# Patient Record
Sex: Male | Born: 1965 | Race: Black or African American | Hispanic: No | Marital: Single | Smoking: Never smoker
Health system: Southern US, Community
[De-identification: ages and names within clinical notes are randomized; demographics above are authoritative.]

## PROBLEM LIST (undated history)

## (undated) ENCOUNTER — Emergency Department (HOSPITAL_COMMUNITY): Admission: EM | Payer: BLUE CROSS/BLUE SHIELD | Source: Home / Self Care

## (undated) DIAGNOSIS — R011 Cardiac murmur, unspecified: Secondary | ICD-10-CM

---

## 2011-08-03 ENCOUNTER — Encounter (HOSPITAL_COMMUNITY): Payer: Self-pay | Admitting: *Deleted

## 2011-08-03 ENCOUNTER — Emergency Department (HOSPITAL_COMMUNITY)
Admission: EM | Admit: 2011-08-03 | Discharge: 2011-08-03 | Disposition: A | Discharge status: Home / Self Care | Payer: Self-pay | Attending: Emergency Medicine | Admitting: Emergency Medicine

## 2011-08-03 DIAGNOSIS — Z880 Allergy status to penicillin: Secondary | ICD-10-CM | POA: Insufficient documentation

## 2011-08-03 DIAGNOSIS — R55 Syncope and collapse: Secondary | ICD-10-CM | POA: Insufficient documentation

## 2011-08-03 NOTE — ED Provider Notes (Signed)
History     CSN: [id number]  Arrival date & time 08/03/11  4098   First MD Initiated Contact with Patient 08/03/11 1108      Chief Complaint  Patient presents with  . Near Syncope    (Consider location/radiation/quality/duration/timing/severity/associated sxs/prior treatment) HPI  States that 3 days ago he "almost passed out" at the job. Stated that he felt tired and hot. Was standing up and suddenly felt hot and laid down on the ground. Denies syncope. No seizure. Was sent home from work. No problem since that time. EMS arrived and pt declined transport. States that he had EKG done at that time that was "ok". Since that time has been asx. Denies headache, dizziness, cp, palpitations, shortness of breath pre fall and currently. No neck pain or back pain. Denies h/o VTE in self or family. No recent hosp/surg/immob. No h/o cancer. Denies exogenous hormone use, no leg pain or swelling  States that he has been stressed out due to mother's recent illness/cardiac arrest. Went from there to his job with little sleep. Also feels dehydrated.   ED Notes, ED Provider Notes from 08/03/11 0000 to 08/03/11 09:56:14       Melissa Rolan Bucco, RN 08/03/2011 09:55      To ED for eval after having a syncopal episode on Friday night at work. States he needs to be cleared to go back to work. Seen by EMS but no provider. States he thinks this is due to lack of sleep. No pain or sob. States he felt hot prior to this episode. No syncope since                 Original note by Graciela Husbands, RN at 08/03/2011 09:55         Melissa Rolan Bucco, RN 08/03/2011 09:55      To ED for eval after having a syncopal episode on Friday night at work. States he needs to be cleared to go back to work. Seen by EMS but no provider. States he thinks this is due to lack of sleep. No pain or sob. States he felt hot prior to this episode. No episode since                History reviewed. No pertinent past medical  history.  History reviewed. No pertinent past surgical history.  History reviewed. No pertinent family history.  History  Substance Use Topics  . Smoking status: Never Smoker   . Smokeless tobacco: Not on file  . Alcohol Use: Yes    Review of Systems  All other systems reviewed and are negative.  except as noted HPI    Allergies  Penicillins  Home Medications  No current outpatient prescriptions on file.  BP 125/74  Pulse 67  Temp 97.5 F (36.4 C) (Oral)  Resp 18  SpO2 100%  Physical Exam  Nursing note and vitals reviewed. Constitutional: He is oriented to person, place, and time. He appears well-developed and well-nourished. No distress.  HENT:  Head: Atraumatic.  Mouth/Throat: Oropharynx is clear and moist.  Eyes: Conjunctivae are normal. Pupils are equal, round, and reactive to light.  Neck: Neck supple.  Cardiovascular: Normal rate, regular rhythm, normal heart sounds and intact distal pulses.  Exam reveals no gallop and no friction rub.   No murmur heard. Pulmonary/Chest: Effort normal. No respiratory distress. He has no wheezes. He has no rales.  Abdominal: Soft. Bowel sounds are normal. There is no tenderness. There is no rebound and  no guarding.  Musculoskeletal: Normal range of motion. He exhibits no edema and no tenderness.  Neurological: He is alert and oriented to person, place, and time. No cranial nerve deficit. He exhibits normal muscle tone. Coordination normal.  Skin: Skin is warm and dry. No rash noted. No erythema.  Psychiatric: He has a normal mood and affect.    Date: 08/03/2011  Rate: 48  Rhythm: sinus bradycardia  QRS Axis: normal  Intervals: normal  ST/T Wave abnormalities: normal  Conduction Disutrbances:none  Narrative Interpretation: moderate voltage criteria LVH, may be nl variant  Old EKG Reviewed: none available    ED Course  Procedures (including critical care time)  Labs Reviewed - No data to display No results  found.   1. Near syncope     MDM  Fatigue, near syncope. Likely related to recent stressors as well as lack of sleep, mild dehydration. EKG with moderate LVH criteria, pt without significant murmur on exam, no cp/sob/palpitations/dizziness. Aware and will f/u as outpatient with PMD. No EMC precluding discharge at this time. Given Precautions for return. PMD f/u.         Forbes Cellar, MD 08/03/11 1247

## 2011-08-03 NOTE — ED Notes (Addendum)
To ED for eval after having a syncopal episode on Friday night at work. States he needs to be cleared to go back to work. Seen by EMS but no provider. States he thinks this is due to lack of sleep. No pain or sob. States he felt hot prior to this episode. No syncope since

## 2011-08-03 NOTE — Discharge Instructions (Signed)
Emotional Crisis Part of your problem today may be due to an emotional crisis. Emotional states can cause many different physical signs and symptoms. These may include:  Chest or stomach pain.   Fluttering heartbeat.   Passing out.   Breathing difficulty.   Headaches.   Trembling.   Hot or cold flashes.   Numbness.   Dizziness.   Unusual muscle pain or fatigue.   Insomnia.  When you have other medical problems, they are often made worse by emotional upsets. Emotional crises can increase your stress and anxiety. Finding ways to reduce your stress level can make you feel better. You will become more capable of dealing with these emotional states. Regular physical exercise such as walking can be very beneficial. Counseling or medicine to treat anxiety or depression may also be needed. See your caregiver if you have further problems or questions about your condition. Document Released: 01/26/2005 Document Revised: 01/15/2011 Document Reviewed: 07/13/2006 [facility] Patient Information 2012 [place], [place].  RESOURCE GUIDE  Dental Problems  Patients with Medicaid: [facility] [phone]                                           [phone] [address] Phone:  [phone]                                                   Phone:  [phone]  If unable to pay or uninsured, contact:  [facility] or [facility]. to become qualified for the adult dental clinic.  Chronic Pain Problems Contact [facility]  [phone] Patients need to be referred by their primary care doctor.  Insufficient Money for Medicine Contact [employer] Way:  call "211" or [facility] [phone].  No Primary Care Doctor Call [facility]  [phone] Other agencies that provide inexpensive medical care    [facility]  [phone]    [facility]  [phone]    [facility]   [phone]    [facility]  [phone]    [facility]  5347384439    [facility]  [phone]  Psychological Services [facility]  [phone] [facility]  [phone] [facility]   [phone] (emergency services [phone])  Abuse/Neglect [facility] Child Abuse Hotline 940-671-6546 [facility] Child Abuse Hotline [phone] (After Hours)  Emergency Shelter [facility] [phone]  [facility] [facility] [phone] [facility] [phone]  MRSA Hotline #:   802-886-5863    [facility] Resources  [facility]  [employer]                           [facility] [address]                     [address]         [address]  [address]  Cristobal Goldmann Phone:  [phone]                                  Phone:  [phone]                   Phone:  [phone]  [facility] Phone:  [phone]  [facility] Child Abuse Hotline (260)229-9500 [phone] (After Hours)

## 2013-09-15 ENCOUNTER — Emergency Department (HOSPITAL_COMMUNITY)
Admission: EM | Admit: 2013-09-15 | Discharge: 2013-09-15 | Disposition: A | Discharge status: Home / Self Care | Payer: Self-pay | Attending: Emergency Medicine | Admitting: Emergency Medicine

## 2013-09-15 ENCOUNTER — Encounter (HOSPITAL_COMMUNITY): Payer: Self-pay | Admitting: Emergency Medicine

## 2013-09-15 DIAGNOSIS — R319 Hematuria, unspecified: Secondary | ICD-10-CM | POA: Insufficient documentation

## 2013-09-15 DIAGNOSIS — R51 Headache: Secondary | ICD-10-CM | POA: Insufficient documentation

## 2013-09-15 DIAGNOSIS — R300 Dysuria: Secondary | ICD-10-CM | POA: Insufficient documentation

## 2013-09-15 DIAGNOSIS — N342 Other urethritis: Secondary | ICD-10-CM | POA: Insufficient documentation

## 2013-09-15 DIAGNOSIS — Z880 Allergy status to penicillin: Secondary | ICD-10-CM | POA: Insufficient documentation

## 2013-09-15 LAB — CBC WITH DIFFERENTIAL/PLATELET
BASOS ABS: 0 10*3/uL (ref 0.0–0.1)
Basophils Relative: 0 % (ref 0–1)
EOS PCT: 1 % (ref 0–5)
Eosinophils Absolute: 0.1 10*3/uL (ref 0.0–0.7)
HCT: 38.6 % — ABNORMAL LOW (ref 39.0–52.0)
Hemoglobin: 12.4 g/dL — ABNORMAL LOW (ref 13.0–17.0)
LYMPHS ABS: 1.6 10*3/uL (ref 0.7–4.0)
LYMPHS PCT: 22 % (ref 12–46)
MCH: 32.1 pg (ref 26.0–34.0)
MCHC: 32.1 g/dL (ref 30.0–36.0)
MCV: 100 fL (ref 78.0–100.0)
Monocytes Absolute: 0.6 10*3/uL (ref 0.1–1.0)
Monocytes Relative: 8 % (ref 3–12)
NEUTROS ABS: 5 10*3/uL (ref 1.7–7.7)
NEUTROS PCT: 69 % (ref 43–77)
PLATELETS: 224 10*3/uL (ref 150–400)
RBC: 3.86 MIL/uL — AB (ref 4.22–5.81)
RDW: 12.9 % (ref 11.5–15.5)
WBC: 7.2 10*3/uL (ref 4.0–10.5)

## 2013-09-15 LAB — I-STAT CHEM 8, ED
BUN: 13 mg/dL (ref 6–23)
CHLORIDE: 101 meq/L (ref 96–112)
CREATININE: 0.8 mg/dL (ref 0.50–1.35)
Calcium, Ion: 1.15 mmol/L (ref 1.12–1.23)
Glucose, Bld: 87 mg/dL (ref 70–99)
HCT: 41 % (ref 39.0–52.0)
Hemoglobin: 13.9 g/dL (ref 13.0–17.0)
POTASSIUM: 3.7 meq/L (ref 3.7–5.3)
SODIUM: 141 meq/L (ref 137–147)
TCO2: 28 mmol/L (ref 0–100)

## 2013-09-15 LAB — URINALYSIS, ROUTINE W REFLEX MICROSCOPIC
BILIRUBIN URINE: NEGATIVE
Glucose, UA: NEGATIVE mg/dL
KETONES UR: 15 mg/dL — AB
NITRITE: NEGATIVE
PH: 5.5 (ref 5.0–8.0)
Protein, ur: 30 mg/dL — AB
SPECIFIC GRAVITY, URINE: 1.034 — AB (ref 1.005–1.030)
Urobilinogen, UA: 1 mg/dL (ref 0.0–1.0)

## 2013-09-15 LAB — URINE MICROSCOPIC-ADD ON

## 2013-09-15 MED ORDER — DOXYCYCLINE HYCLATE 100 MG PO TABS
100.0000 mg | ORAL_TABLET | Freq: Once | ORAL | Status: AC
  Administered 2013-09-15: 100 mg via ORAL
  Filled 2013-09-15: qty 1

## 2013-09-15 MED ORDER — CIPROFLOXACIN HCL 500 MG PO TABS
750.0000 mg | ORAL_TABLET | Freq: Once | ORAL | Status: AC
  Administered 2013-09-15: 750 mg via ORAL
  Filled 2013-09-15: qty 2

## 2013-09-15 MED ORDER — DOXYCYCLINE HYCLATE 100 MG PO TABS: 100.0000 mg | ORAL_TABLET | Freq: Two times a day (BID) | ORAL | Status: DC

## 2013-09-15 MED ORDER — AZITHROMYCIN 250 MG PO TABS
1000.0000 mg | ORAL_TABLET | Freq: Once | ORAL | Status: AC
  Administered 2013-09-15: 1000 mg via ORAL
  Filled 2013-09-15: qty 4

## 2013-09-15 MED ORDER — CIPROFLOXACIN HCL 750 MG PO TABS: 750.0000 mg | ORAL_TABLET | Freq: Every day | ORAL | Status: DC

## 2013-09-15 NOTE — Discharge Instructions (Signed)
Dysuria Dysuria is the medical term for pain with urination. There are many causes for dysuria, but urinary tract infection is the most common. If a urinalysis was performed it can show that there is a urinary tract infection. A urine culture confirms that you or your child is sick. You will need to follow up with a healthcare provider because:  If a urine culture was done you will need to know the culture results and treatment recommendations.  If the urine culture was positive, you or your child will need to be put on antibiotics or know if the antibiotics prescribed are the right antibiotics for your urinary tract infection.  If the urine culture is negative (no urinary tract infection), then other causes may need to be explored or antibiotics need to be stopped. Today laboratory work may have been done and there does not seem to be an infection. If cultures were done they will take at least 24 to 48 hours to be completed. Today x-rays may have been taken and they read as normal. No cause can be found for the problems. The x-rays may be re-read by a radiologist and you will be contacted if additional findings are made. You or your child may have been put on medications to help with this problem until you can see your primary caregiver. If the problems get better, see your primary caregiver if the problems return. If you were given antibiotics (medications which kill germs), take all of the mediations as directed for the full course of treatment.  If laboratory work was done, you need to find the results. Leave a telephone number where you can be reached. If this is not possible, make sure you find out how you are to get test results. HOME CARE INSTRUCTIONS   Drink lots of fluids. For adults, drink eight, 8 ounce glasses of clear juice or water a day. For children, replace fluids as suggested by your caregiver.  Empty the bladder often. Avoid holding urine for long periods of time.  After a bowel  movement, women should cleanse front to back, using each tissue only once.  Empty your bladder before and after sexual intercourse.  Take all the medicine given to you until it is gone. You may feel better in a few days, but TAKE ALL MEDICINE.  Avoid caffeine, tea, alcohol and carbonated beverages, because they tend to irritate the bladder.  In men, alcohol may irritate the prostate.  Only take over-the-counter or prescription medicines for pain, discomfort, or fever as directed by your caregiver.  If your caregiver has given you a follow-up appointment, it is very important to keep that appointment. Not keeping the appointment could result in a chronic or permanent injury, pain, and disability. If there is any problem keeping the appointment, you must call back to this facility for assistance. SEEK IMMEDIATE MEDICAL CARE IF:   Back pain develops.  A fever develops.  There is nausea (feeling sick to your stomach) or vomiting (throwing up).  Problems are no better with medications or are getting worse. MAKE SURE YOU:   Understand these instructions.  Will watch your condition.  Will get help right away if you are not doing well or get worse. Document Released: 10/25/2003 Document Revised: 04/20/2011 Document Reviewed: 09/01/2007 [facility] Patient Information 2015 [place], [place]. This information is not intended to replace advice given to you by your health care provider. Make sure you discuss any questions you have with your health care provider.  Hematuria Hematuria is  blood in your urine. It can be caused by a bladder infection, kidney infection, prostate infection, kidney stone, or cancer of your urinary tract. Infections can usually be treated with medicine, and a kidney stone usually will pass through your urine. If neither of these is the cause of your hematuria, further workup to find out the reason may be needed. It is very important that you tell your health care provider  about any blood you see in your urine, even if the blood stops without treatment or happens without causing pain. Blood in your urine that happens and then stops and then happens again can be a symptom of a very serious condition. Also, pain is not a symptom in the initial stages of many urinary cancers. HOME CARE INSTRUCTIONS   Drink lots of fluid, 3-4 quarts a day. If you have been diagnosed with an infection, cranberry juice is especially recommended, in addition to large amounts of water.  Avoid caffeine, tea, and carbonated beverages because they tend to irritate the bladder.  Avoid alcohol because it may irritate the prostate.  Take all medicines as directed by your health care provider.  If you were prescribed an antibiotic medicine, finish it all even if you start to feel better.  If you have been diagnosed with a kidney stone, follow your health care provider's instructions regarding straining your urine to catch the stone.  Empty your bladder often. Avoid holding urine for long periods of time.  After a bowel movement, women should cleanse front to back. Use each tissue only once.  Empty your bladder before and after sexual intercourse if you are a male. SEEK MEDICAL CARE IF:  You develop back pain.  You have a fever.  You have a feeling of sickness in your stomach (nausea) or vomiting.  Your symptoms are not better in 3 days. Return sooner if you are getting worse. SEEK IMMEDIATE MEDICAL CARE IF:   You develop severe vomiting and are unable to keep the medicine down.  You develop severe back or abdominal pain despite taking your medicines.  You begin passing a large amount of blood or clots in your urine.  You feel extremely weak or faint, or you pass out. MAKE SURE YOU:   Understand these instructions.  Will watch your condition.  Will get help right away if you are not doing well or get worse. Document Released: 01/26/2005 Document Revised: 06/12/2013  Document Reviewed: 09/26/2012 [facility] Patient Information 2015 [place]. This information is not intended to replace advice given to you by your health care provider. Make sure you discuss any questions you have with your health care provider. Discussed.  Cultures are, pending.  You'll be notified with results You have been given prescriptions for antibiotics to treat urethritis.  Please take these as directed until, completed if you continue to have blood in your urine.  Please make an appointment with urologist for further evaluation

## 2013-09-15 NOTE — ED Notes (Addendum)
Pt presents with concerns of bloody urine and burning at the end of urination staring last night. Pt denies penile discharge

## 2013-09-15 NOTE — ED Provider Notes (Signed)
CSN: [id number]     Arrival date & time 09/15/13  1804 History   First MD Initiated Contact with Patient 09/15/13 1953     Chief Complaint  Patient presents with  . Hematuria     (Consider location/radiation/quality/duration/timing/severity/associated sxs/prior Treatment) HPI Comments: Noticed blood in his urine yesterday  That has continued throughout the day.  States unprotected intercourse, denies penile discharge but does report pain at end of urinary stream, and headache   Patient is a 48 y.o. male presenting with hematuria. The history is provided by the patient.  Hematuria This is a new problem. The current episode started yesterday. The problem occurs constantly. The problem has been unchanged. Associated symptoms include headaches. Pertinent negatives include no chills, diaphoresis, numbness, rash or weakness. Nothing aggravates the symptoms. He has tried nothing for the symptoms. The treatment provided no relief.    History reviewed. No pertinent past medical history. History reviewed. No pertinent past surgical history. History reviewed. No pertinent family history. History  Substance Use Topics  . Smoking status: Never Smoker   . Smokeless tobacco: Not on file  . Alcohol Use: Yes    Review of Systems  Constitutional: Negative for chills and diaphoresis.  Genitourinary: Positive for dysuria and hematuria. Negative for decreased urine volume, discharge, penile swelling, scrotal swelling, penile pain and testicular pain.  Skin: Negative for rash.  Neurological: Positive for headaches. Negative for weakness and numbness.      Allergies  Penicillins  Home Medications   Prior to Admission medications   Not on File   BP 105/83  Pulse 77  Temp(Src) 98.6 F (37 C) (Oral)  Resp 12  SpO2 99% Physical Exam  Nursing note and vitals reviewed. Constitutional: He appears well-developed and well-nourished.  HENT:  Head: Normocephalic.  Eyes: Pupils are equal, round,  and reactive to light.  Neck: Normal range of motion.  Cardiovascular: Normal rate and regular rhythm.   Pulmonary/Chest: Effort normal.  Abdominal: Soft. Bowel sounds are normal. He exhibits no distension.  Genitourinary: Penis normal. Right testis shows no tenderness. Left testis shows no tenderness. Circumcised. No penile tenderness.  Musculoskeletal: Normal range of motion.  Neurological: He is alert.  Skin: Skin is warm and dry.    ED Course  Procedures (including critical care time) Labs Review Labs Reviewed  URINALYSIS, ROUTINE W REFLEX MICROSCOPIC - Abnormal; Notable for the following:    APPearance HAZY (*)    Specific Gravity, Urine 1.034 (*)    Hgb urine dipstick LARGE (*)    Ketones, ur 15 (*)    Protein, ur 30 (*)    Leukocytes, UA SMALL (*)    All other components within normal limits  URINE MICROSCOPIC-ADD ON - Abnormal; Notable for the following:    Bacteria, UA FEW (*)    All other components within normal limits  CBC WITH DIFFERENTIAL - Abnormal; Notable for the following:    RBC 3.86 (*)    Hemoglobin 12.4 (*)    HCT 38.6 (*)    All other components within normal limits  GC/CHLAMYDIA PROBE AMP  RPR  HIV ANTIBODY (ROUTINE TESTING)  I-STAT CHEM 8, ED    Imaging Review No results found.   EKG Interpretation None      MDM   Final diagnoses:  Hematuria  Dysuria  Urethritis     Due to patient's penicillin allergy.  He is been given Cipro, doxycycline, and azithromycin, and discharged home with prescriptions for Cipro and doxycycline and been told.  His  cultures are pending.  If he continues to have hematuria.  He is to followup with Dr. Laverle Patter at [facility].  Urology    Arman Filter, NP 09/15/13 2156

## 2013-09-15 NOTE — ED Notes (Signed)
Pt also reports headaches starting yesterday

## 2013-09-15 NOTE — ED Notes (Signed)
Pt comfortable with discharge and follow up instructions. Prescriptions x2. Pt declines wheelchair, escorted to waiting area. 

## 2013-09-16 LAB — GC/CHLAMYDIA PROBE AMP
CT PROBE, AMP APTIMA: POSITIVE — AB
GC PROBE AMP APTIMA: NEGATIVE

## 2013-09-16 LAB — HIV ANTIBODY (ROUTINE TESTING W REFLEX): HIV 1&2 Ab, 4th Generation: NONREACTIVE

## 2013-09-16 LAB — RPR

## 2013-09-16 NOTE — ED Provider Notes (Signed)
Medical screening examination/treatment/procedure(s) were performed by non-physician practitioner and as supervising physician I was immediately available for consultation/collaboration.   EKG Interpretation None       Vanetta MuldersScott Ridgely Anastacio, MD 09/16/13 [phone]

## 2013-09-17 ENCOUNTER — Telehealth (HOSPITAL_BASED_OUTPATIENT_CLINIC_OR_DEPARTMENT_OTHER): Payer: Self-pay | Admitting: Emergency Medicine

## 2013-09-17 NOTE — Telephone Encounter (Signed)
+  Chlamydia. Patient treated with Zithromax. [employer] faxed. 

## 2014-09-25 ENCOUNTER — Emergency Department (HOSPITAL_COMMUNITY)
Admission: EM | Admit: 2014-09-25 | Discharge: 2014-09-25 | Disposition: A | Discharge status: Home / Self Care | Payer: BLUE CROSS/BLUE SHIELD | Attending: Emergency Medicine | Admitting: Emergency Medicine

## 2014-09-25 ENCOUNTER — Encounter (HOSPITAL_COMMUNITY): Payer: Self-pay | Admitting: Neurology

## 2014-09-25 DIAGNOSIS — Z202 Contact with and (suspected) exposure to infections with a predominantly sexual mode of transmission: Secondary | ICD-10-CM | POA: Insufficient documentation

## 2014-09-25 DIAGNOSIS — K219 Gastro-esophageal reflux disease without esophagitis: Secondary | ICD-10-CM

## 2014-09-25 DIAGNOSIS — J029 Acute pharyngitis, unspecified: Secondary | ICD-10-CM | POA: Diagnosis present

## 2014-09-25 DIAGNOSIS — Z880 Allergy status to penicillin: Secondary | ICD-10-CM | POA: Insufficient documentation

## 2014-09-25 DIAGNOSIS — N390 Urinary tract infection, site not specified: Secondary | ICD-10-CM | POA: Diagnosis not present

## 2014-09-25 LAB — URINALYSIS, ROUTINE W REFLEX MICROSCOPIC
BILIRUBIN URINE: NEGATIVE
GLUCOSE, UA: NEGATIVE mg/dL
HGB URINE DIPSTICK: NEGATIVE
Ketones, ur: NEGATIVE mg/dL
Nitrite: NEGATIVE
PROTEIN: NEGATIVE mg/dL
Specific Gravity, Urine: 1.024 (ref 1.005–1.030)
UROBILINOGEN UA: 1 mg/dL (ref 0.0–1.0)
pH: 6 (ref 5.0–8.0)

## 2014-09-25 LAB — URINE MICROSCOPIC-ADD ON

## 2014-09-25 LAB — CBG MONITORING, ED: Glucose-Capillary: 79 mg/dL (ref 65–99)

## 2014-09-25 MED ORDER — LIDOCAINE HCL (PF) 1 % IJ SOLN
INTRAMUSCULAR | Status: AC
  Administered 2014-09-25: 0.9 mL
  Filled 2014-09-25: qty 5

## 2014-09-25 MED ORDER — AZITHROMYCIN 250 MG PO TABS
1000.0000 mg | ORAL_TABLET | Freq: Once | ORAL | Status: AC
  Administered 2014-09-25: 1000 mg via ORAL
  Filled 2014-09-25: qty 4

## 2014-09-25 MED ORDER — NITROFURANTOIN MONOHYD MACRO 100 MG PO CAPS: 100.0000 mg | ORAL_CAPSULE | Freq: Two times a day (BID) | ORAL | Status: DC

## 2014-09-25 MED ORDER — CEFTRIAXONE SODIUM 250 MG IJ SOLR
250.0000 mg | Freq: Once | INTRAMUSCULAR | Status: AC
  Administered 2014-09-25: 250 mg via INTRAMUSCULAR
  Filled 2014-09-25: qty 250

## 2014-09-25 MED ORDER — OMEPRAZOLE 20 MG PO CPDR: 20.0000 mg | DELAYED_RELEASE_CAPSULE | Freq: Every day | ORAL | Status: DC

## 2014-09-25 MED ORDER — LIDOCAINE HCL (PF) 1 % IJ SOLN
0.9000 mL | Freq: Once | INTRAMUSCULAR | Status: AC
  Administered 2014-09-25: 0.9 mL

## 2014-09-25 NOTE — ED Notes (Signed)
CBG 79 

## 2014-09-25 NOTE — ED Provider Notes (Signed)
CSN: [id number]     Arrival date & time 09/25/14  1036 History   First MD Initiated Contact with Patient 09/25/14 1050     Chief Complaint  Patient presents with  . Sore Throat  . Urinary Frequency  . Gastrophageal Reflux   HPI   49 year old male presents today with numerous complaints. Patient reports that he recently has gotten health insurance, and will like a primary care provider. He reports that he was contacted by sexual partner who reports she used positive for chlamydia and he needs to seek treatment. He reports over the last several days he's had frequent urinations. He denies any penile discharge, pain in the testicles or penis. Patient also reports a "burning in his throat" when he eats. He reports this is been persistent for extended period of time is unable to quantify. He notes he is had a history of pharyngitis in the past. Patient denies any difficulty breathing, swallowing, handling secretions, changes in voice, or swelling of the neck. Patient denies fever, chills, nausea, vomiting, abdominal pain, flank or back pain.    History reviewed. No pertinent past medical history. History reviewed. No pertinent past surgical history. No family history on file. Social History  Substance Use Topics  . Smoking status: Never Smoker   . Smokeless tobacco: None  . Alcohol Use: Yes    Review of Systems  All other systems reviewed and are negative.   Allergies  Penicillins  Home Medications   Prior to Admission medications   Medication Sig Start Date End Date Taking? Authorizing Provider  nitrofurantoin, macrocrystal-monohydrate, (MACROBID) 100 MG capsule Take 1 capsule (100 mg total) by mouth 2 (two) times daily. 09/25/14   Eyvonne Mechanic, PA-C  omeprazole (PRILOSEC) 20 MG capsule Take 1 capsule (20 mg total) by mouth daily. 09/25/14   Eyvonne Mechanic, PA-C   BP 127/67 mmHg  Pulse 74  Temp(Src) 97.6 F (36.4 C) (Oral)  Resp 22  SpO2 99%   Physical Exam  Constitutional:  He is oriented to person, place, and time. He appears well-developed and well-nourished.  HENT:  Head: Normocephalic and atraumatic.  Mouth/Throat: Uvula is midline, oropharynx is clear and moist and mucous membranes are normal. No oropharyngeal exudate, posterior oropharyngeal edema, posterior oropharyngeal erythema or tonsillar abscesses.  Eyes: Conjunctivae are normal. Pupils are equal, round, and reactive to light. Right eye exhibits no discharge. Left eye exhibits no discharge. No scleral icterus.  Neck: Normal range of motion. Neck supple. No JVD present. No tracheal deviation present.  Pulmonary/Chest: Effort normal. No stridor.  Abdominal: Soft. He exhibits no distension and no mass. There is no tenderness. There is no rebound, no guarding and no CVA tenderness.  Genitourinary: Testes normal and penis normal. Right testis shows no mass, no swelling and no tenderness. Right testis is descended. Cremasteric reflex is not absent on the right side. Left testis shows no mass, no swelling and no tenderness. Left testis is descended. Cremasteric reflex is not absent on the left side. No penile erythema. No discharge found.  Neurological: He is alert and oriented to person, place, and time. Coordination normal.  Psychiatric: He has a normal mood and affect. His behavior is normal. Judgment and thought content normal.  Nursing note and vitals reviewed.   ED Course  Procedures (including critical care time) Labs Review Labs Reviewed  URINALYSIS, ROUTINE W REFLEX MICROSCOPIC (NOT AT [facility]) - Abnormal; Notable for the following:    Leukocytes, UA MODERATE (*)    All other components within  normal limits  URINE MICROSCOPIC-ADD ON - Abnormal; Notable for the following:    Squamous Epithelial / LPF FEW (*)    Bacteria, UA FEW (*)    All other components within normal limits  RPR  HIV ANTIBODY (ROUTINE TESTING)  CBG MONITORING, ED  GC/CHLAMYDIA PROBE AMP ([place]) NOT AT [facility]    Imaging  Review No results found. I have personally reviewed and evaluated these images and lab results as part of my medical decision-making.   EKG Interpretation None      MDM   Final diagnoses:  STD exposure  Urinary tract infection without hematuria, site unspecified  Gastroesophageal reflux disease, esophagitis presence not specified    Labs: See above  Imaging:  Consults:  Therapeutics: Azithromycin, Rocephin  Discharge Meds: Omeprazole, Atrovent 1  Assessment/Plan: Patient presents with STD exposure, signs and urinalysis that appeared to be urinary tract infection. He will be prophylactically treated for STDs quitting gonorrhea Chlamydia, and given a short course of antibiotics for urinary tract infection. Patient also has complaints of indigestion, no red flags. Is given instructions follow-up with primary care for further evaluation and management of ongoing symptoms.         Eyvonne Mechanic, PA-C 09/26/14 1246  Melene Plan, DO 09/26/14 1250

## 2014-09-25 NOTE — Discharge Instructions (Signed)
°Emergency Department Resource Guide °1) Find a Doctor and Pay Out of Pocket °Although you won't have to find out who is covered by your insurance plan, it is a good idea to ask around and get recommendations. You will then need to call the office and see if the doctor you have chosen will accept you as a new patient and what types of options they offer for patients who are self-pay. Some doctors offer discounts or will set up payment plans for their patients who do not have insurance, but you will need to ask so you aren't surprised when you get to your appointment. ° °2) Contact Your Local Health Department °Not all health departments have doctors that can see patients for sick visits, but many do, so it is worth a call to see if yours does. If you don't know where your local health department is, you can check in your phone book. The CDC also has a tool to help you locate your state's health department, and many state websites also have listings of all of their local health departments. ° °3) Find a Walk-in Clinic °If your illness is not likely to be very severe or complicated, you may want to try a walk in clinic. These are popping up all over the country in pharmacies, drugstores, and shopping centers. They're usually staffed by nurse practitioners or physician assistants that have been trained to treat common illnesses and complaints. They're usually fairly quick and inexpensive. However, if you have serious medical issues or chronic medical problems, these are probably not your best option. ° °No Primary Care Doctor: °- Call [facility] Connect at  832-8000 - they can help you locate a primary care doctor that  accepts your insurance, provides certain services, etc. °- Physician Referral Service- 1-800-533-3463 ° °Chronic Pain Problems: °Organization         Address  Phone   Notes  °[facility]  (336) 297-2271 Patients need to be referred by their primary care doctor.  ° °Medication  Assistance: °Organization         Address  Phone   Notes  °[place] [facility] [address] °[place], [place] [address] (336) 641-8030 --Must be a resident of [place] °-- Must have NO insurance coverage whatsoever (no Medicaid/ Medicare, etc.) °-- The pt. MUST have a primary care doctor that directs their care regularly and follows them in the community °  °[facility]  (866) 331-1348   °[employer]  (888) 892-1162   ° °Agencies that provide inexpensive medical care: °Organization         Address  Phone   Notes  °[facility]  (336) 832-8035   °[facility]    (336) 832-7272   °[facility] [address] °[place], [place] [address] (336) 832-4777   °[facility] [address], °[place] (336) 271-4999   °[facility]    (336) 373-0678   °[facility]    (336) 272-1050   °[facility] ° [address], [place] Phone:  (336) 832-4444, Fax:  (336) 832-4440 Hours of Operation:  9 am - 6 pm, M-F.  Also accepts Medicaid/Medicare and self-pay.  °[facility] ° [address], [place] Phone: (336) 832-3150, Fax: (336) 832-3151. Hours of Operation:  8:30 am - 5:30 pm, M-F.  Also accepts Medicaid and self-pay.  °[facility] [place] [address], [place] Phone: (336) 878-6027   °[facility] [address], [place], [place] (336)723-1848, Ext. 123 Mondays & Thursdays: 7-9 AM.  First 15 patients are seen on a first come, first serve basis. °  ° °Medicaid-accepting [place] Providers: ° °Organization         Address  Phone   Notes  °[facility] [address], [place] (336) 641-2100 Also accepts self-pay patients.  °[facility] [address], [place] ° (336) 856-9996   °[facility] [address], [place]  (336) 288-8857   °[facility] [address] [place] [address], [place] (336) 299-7000   °Veita Bland [address], [place]  ° (336) 373-1557 Only accepts [place] Access Medicaid patients after they have their name applied to their card.  ° °Self-Pay (no insurance) in [place]: ° °Organization         Address  Phone   Notes  °Sickle Cell Patients, [facility] [address], [place] (336) 832-1970   °[facility] [address], [place] (336) 832-4400   °[facility] ° [address], [place] (336) 992-4800   °[facility]/Dr. Osei-Bonsu ° [address] [place] [address], [place] or [address], [place] (336) 841-8500 Phone number for both [place] and [place] locations is the same.  °[facility] [address], [place] (336) 299-0000   °[facility] [address] [place] [address], [place] or [address] (336) 852-7530 °(336) 878-2260   °[facility] [address], [place] (336) 350-1642, phone; (336) 294-5005, fax Sees patients 1st and 3rd Saturday of every month.  Must not qualify for public or private insurance (i.e. Medicaid, Medicare, [place] Health Choice, Veterans' Benefits) • Household income should be no more than 200% of the poverty level •The clinic cannot treat you if you are pregnant or think you are pregnant • Sexually transmitted diseases are not treated at the clinic.  ° ° °Dental Care: °Organization         Address  Phone  Notes  °[place] [facility] [address], [place] (336) 641-6152 Accepts children up to age 21 who are enrolled in Medicaid or [place] Health Choice; pregnant women with a Medicaid card; and children who have applied for Medicaid or [place] Health Choice, but were declined, whose parents can pay a reduced fee at time of service.  °[place]  [facility] [place]  [address], [place] (336) 641-7733 Accepts children up to age 21 who are enrolled in Medicaid or [place] Health Choice; pregnant women with a Medicaid card; and children who have applied for Medicaid or [place] Health Choice, but were declined, whose parents can pay a reduced fee at time of service.  °[facility] ° [address], [place] (336) 641-4533 Patients are seen by appointment only. Walk-ins are not accepted. [facility] will see patients 18 years of age and older. °Monday - Tuesday (8am-5pm) °Most Wednesdays (8:30-5pm) °$30 per visit, cash only  °[facility] ° [address], [place] (336) 641-4533 Patients are seen by appointment only. Walk-ins are not accepted. [facility] will see patients 18 years of age and older. °One   Wednesday Evening (Monthly: Volunteer Based).  $30 per visit, cash only  °[facility]  (919) 537-3737 for adults; Children under age 4, call Graduate Pediatric Dentistry at (919) [phone]. Children aged 4-14, please call (919) 537-3737 to request a pediatric application. ° Dental services are provided in all areas of dental care including fillings, crowns and bridges, complete and partial dentures, implants, gum treatment, root canals, and extractions. Preventive care is also provided. Treatment is provided to both adults and children. °Patients are selected via a lottery and there is often a waiting list. °  °[facility] [address], °[place] ° (336) 763-8833 [email or web address] °  °[facility] [address], [place], [place] (336)723-1848, Ext. 123 Second and Fourth Thursday of each month, opens at 6:30 AM; Clinic ends at 9 AM.  Patients are seen on a first-come first-served basis, and a limited number are seen during each clinic.  ° °[facility] ° [address], [place], [place] (336) 723-7904    Eligibility Requirements °You must have lived in [place], [place], or [place] counties for at least the last three months. °  You cannot be eligible for state or federal sponsored healthcare insurance, including Veterans Administration, Medicaid, or Medicare. °  You generally cannot be eligible for healthcare insurance through your employer.  °  How to apply: °Eligibility screenings are held every Tuesday and Wednesday afternoon from 1:00 pm until 4:00 pm. You do not need an appointment for the interview!  °[facility] [address], [place], [place] 336-631-2330   °[facility]  336-342-8273   °[facility]  336-703-3100   °[facility]  336-570-6415   ° °Behavioral Health Resources in the Community: °Intensive Outpatient Programs °Organization         Address  Phone  Notes  °[facility] [address], [place], [place] 336-878-6098   °[facility] [address], [place], [place] 336-832-9800   °[facility] [address], [place], [place] ° 336-882-2125   °[facility] [address],  °[place], [place] 1-800-853-5163 or 336-641-4981   °Substance Abuse Resources °Organization         Address  Phone  Notes  °[facility]  336-882-2125   °[facility]  336-784-9470   °[facility]  336-285-9073   °[facility]  336-845-3988   °Residential & Outpatient Substance Abuse Program  1-800-659-3381   °Psychological Services °Organization         Address  Phone  Notes  °[facility]  336- 832-9600   °[facility]  336- 378-7881   °[facility] [address], [place] 1-800-853-5163 or 336-641-4981   ° °Mobile Crisis Teams °Organization         Address  Phone  Notes  °[facility], Mobile Crisis Care Unit  1-877-626-1772   °Assertive °[facility] ° [address]  [place], [place] 336-834-9664   °Sharon DeEsch [address] °[place] 336-554-5454   ° °Self-Help/Support Groups °Organization         Address  Phone             Notes  °[facility]  - variety of support groups  336- 373-1402 Call for more information  °Narcotics Anonymous (NA), [facility] [address], °[place]  2 meetings at this location  ° °  Residential Treatment Programs Organization         Address  Phone  Notes  [facility] [address],    [place]  1-610-960-4540   [facility]  [address] [place] [address], [place], [place] 478-295-6213   [facility] [address] [place], [place] 086-578-4696 Admissions: 8am-3pm M-F  [facility] [address]    [place], [place] 295-284-1324   [facility] [address] [place], [place], [place] 401-027-2536   [facility] [address]  [place], [place] 644-034-7425   [facility] Intensive Outpatient [address], [place], [place] 956-387-5643   [facility] ([facility] [address] [place]  [place], [place] 3-295-188-4166 or 671-027-4731   [facility] [address], [place], [place] 323-557-3220 Accepts Medicaid  [facility] [address],  [place] 2-542-706-2376 Substance Abuse/Addiction Treatment   [facility]         Address  Phone  Notes  [facility]  4505478213   Angie Fava, PhD [address] [place], [place]   (408)484-8459 or 505-031-9244   [facility]   [address] [place], [place] 813-141-3818   [facility] [address] [place], [place] [phone] Insurance/Medicaid/sponsorship through [facility] [address]                                    [place], [place] [phone] Therapy/tele-psych/case    [facility] [address], [place] [phone]    Dr. Lolly Mustache  [phone]   [facility]  [employer] [facility] Dept. 1) [address] [place] 2) [address] [place] 3)  [address], [place] [phone]  (986)822-7839   [facility] Child Abuse Hotline 480-723-0087 or [phone] (After Hours)     Please use Anaprox as directed, please follow-up with primary care provider for further evaluation and management. Please monitor for any worsening signs or symptoms return immediately if any present.

## 2014-09-25 NOTE — ED Notes (Signed)
Pt here requesting a physical. Has been having sore throat, burning in his throat after he eats and thinks is r/t acid reflux. Also frequent urination. Wants referral for primary care MD.

## 2014-09-25 NOTE — ED Notes (Signed)
Pt denies complaints. Alert x4 respirations easy non labored

## 2014-09-26 LAB — RPR: RPR Ser Ql: NONREACTIVE

## 2014-09-26 LAB — HIV ANTIBODY (ROUTINE TESTING W REFLEX): HIV Screen 4th Generation wRfx: NONREACTIVE

## 2015-06-11 ENCOUNTER — Encounter (HOSPITAL_COMMUNITY): Payer: Self-pay | Admitting: *Deleted

## 2015-06-11 ENCOUNTER — Emergency Department (HOSPITAL_COMMUNITY)
Admission: EM | Admit: 2015-06-11 | Discharge: 2015-06-12 | Disposition: A | Discharge status: Home / Self Care | Payer: BLUE CROSS/BLUE SHIELD | Attending: Emergency Medicine | Admitting: Emergency Medicine

## 2015-06-11 DIAGNOSIS — H9202 Otalgia, left ear: Secondary | ICD-10-CM | POA: Diagnosis present

## 2015-06-11 DIAGNOSIS — Z792 Long term (current) use of antibiotics: Secondary | ICD-10-CM | POA: Insufficient documentation

## 2015-06-11 DIAGNOSIS — H6122 Impacted cerumen, left ear: Secondary | ICD-10-CM | POA: Insufficient documentation

## 2015-06-11 DIAGNOSIS — Z79899 Other long term (current) drug therapy: Secondary | ICD-10-CM | POA: Insufficient documentation

## 2015-06-11 DIAGNOSIS — Z880 Allergy status to penicillin: Secondary | ICD-10-CM | POA: Insufficient documentation

## 2015-06-11 MED ORDER — CARBAMIDE PEROXIDE 6.5 % OT SOLN
5.0000 [drp] | Freq: Once | OTIC | Status: AC
  Administered 2015-06-11: 5 [drp] via OTIC
  Filled 2015-06-11: qty 15

## 2015-06-11 NOTE — ED Provider Notes (Signed)
CSN: [id number]     Arrival date & time 06/11/15  2154 History  By signing my name below, I, [facility] Center[facility]arrissa Romero, attest that this documentation has been prepared under the direction and in the presence of [employer] WholesaleJaime Pilcher Ward, VF CorporationPA-C. Electronically Signed: Randell PatientMarrissa Romero, ED Scribe. 06/11/2015. 1:04 AM.   Chief Complaint  Patient presents with  . Otalgia   The history is provided by the patient. No language interpreter was used.  HPI Comments: Keith Romero is a [age] y.o. male who presents to the Emergency Department complaining of constant, throbbing, gradually worsening left ear pain onset 2 days ago.  Denies fever, nasal congestion and cough. No recent illness. Denies any other associated symptoms. No medications or remedies tried prior to arrival. No alleviating or aggravating factors noted.   History reviewed. No pertinent past medical history. History reviewed. No pertinent past surgical history. No family history on file. Social History  Substance Use Topics  . Smoking status: Never Smoker   . Smokeless tobacco: None  . Alcohol Use: Yes    Review of Systems  HENT: Positive for ear pain. Negative for congestion.   Respiratory: Negative for cough.    Allergies  Penicillins  Home Medications   Prior to Admission medications   Medication Sig Start Date End Date Taking? Authorizing Provider  nitrofurantoin, macrocrystal-monohydrate, (MACROBID) 100 MG capsule Take 1 capsule (100 mg total) by mouth 2 (two) times daily. 09/25/14   Eyvonne MechanicJeffrey Hedges, PA-C  omeprazole (PRILOSEC) 20 MG capsule Take 1 capsule (20 mg total) by mouth daily. 09/25/14   Jeffrey Hedges, PA-C   BP 171/80 mmHg  Pulse 61  Temp(Src) 97.8 F (36.6 C)  Resp 18  Ht 6' (1.829 m)  Wt 76.771 kg  BMI 22.95 kg/m2  SpO2 100% Physical Exam  Constitutional: He is oriented to person, place, and time. He appears well-developed and well-nourished. No distress.  HENT:  Head: Normocephalic and atraumatic.  Mouth/Throat:  Uvula is midline and oropharynx is clear and moist. No oropharyngeal exudate.  Left ear with cerumen impaction. No tenderness to palpation of left sided dentition.  Eyes: Conjunctivae are normal.  Neck: Normal range of motion.  Cardiovascular: Normal rate, regular rhythm, normal heart sounds and intact distal pulses.  Exam reveals no gallop and no friction rub.   No murmur heard. Pulmonary/Chest: Effort normal and breath sounds normal. No respiratory distress. He has no wheezes. He has no rales.  Musculoskeletal: Normal range of motion.  Neurological: He is alert and oriented to person, place, and time.  Skin: Skin is warm and dry.  Psychiatric: He has a normal mood and affect. His behavior is normal.  Nursing note and vitals reviewed.   ED Course  .Ear Cerumen Removal Date/Time: 06/12/2015 1:09 AM Performed by: Janyth ContesWARD, JAIME PILCHER Authorized by: Janyth ContesWARD, JAIME PILCHER Consent: Verbal consent obtained. Local anesthetic: none Location details: left ear Procedure type: curette    DIAGNOSTIC STUDIES: Oxygen Saturation is 97% on RA, normal by my interpretation.     Labs Review Labs Reviewed - No data to display  Imaging Review No results found. I have personally reviewed and evaluated these images and lab results as part of my medical decision-making.   MDM   Final diagnoses:  Cerumen impaction, left   Keith Romero presents to ED for left ear pain. On exam, left ear with cerumen impaction. Nursing staff flushed ear with normal saline with minimal drainage. Left ear cerumen removal with curette performed by me. Patient tolerated procedure well. Large amount  of cerumen removed. Still hard cerumen remaining in ear. Debrox provided in ED. Use at home and follow up with PCP in 2-3 days. Return precautions and home care instructions discussed with patient. All questions answered.   I personally performed the services described in this documentation, which was scribed in my presence.  The recorded information has been reviewed and is accurate.    [facility] Ward, PA-C 06/12/15 0114  Laurence Spates, MD 06/16/15 [phone]

## 2015-06-11 NOTE — ED Notes (Signed)
Lt ear pain worse today

## 2015-06-12 NOTE — Discharge Instructions (Signed)
Please use ear drops twice daily. I strongly encourage you to find a primary care provider. Look on your insurance card and call the number listed for help finding a physician. Return to the ER for any new or worsening symptoms, any additional concerns.

## 2015-09-08 ENCOUNTER — Emergency Department (HOSPITAL_COMMUNITY)
Admission: EM | Admit: 2015-09-08 | Discharge: 2015-09-08 | Disposition: A | Discharge status: Home / Self Care | Payer: BLUE CROSS/BLUE SHIELD | Attending: Emergency Medicine | Admitting: Emergency Medicine

## 2015-09-08 ENCOUNTER — Emergency Department (HOSPITAL_COMMUNITY): Payer: BLUE CROSS/BLUE SHIELD

## 2015-09-08 ENCOUNTER — Encounter (HOSPITAL_COMMUNITY): Payer: Self-pay | Admitting: Emergency Medicine

## 2015-09-08 DIAGNOSIS — K4090 Unilateral inguinal hernia, without obstruction or gangrene, not specified as recurrent: Secondary | ICD-10-CM | POA: Insufficient documentation

## 2015-09-08 DIAGNOSIS — N4400 Torsion of testis, unspecified: Secondary | ICD-10-CM

## 2015-09-08 DIAGNOSIS — R1030 Lower abdominal pain, unspecified: Secondary | ICD-10-CM | POA: Diagnosis present

## 2015-09-08 DIAGNOSIS — N390 Urinary tract infection, site not specified: Secondary | ICD-10-CM | POA: Insufficient documentation

## 2015-09-08 DIAGNOSIS — Z202 Contact with and (suspected) exposure to infections with a predominantly sexual mode of transmission: Secondary | ICD-10-CM | POA: Diagnosis not present

## 2015-09-08 LAB — URINALYSIS, ROUTINE W REFLEX MICROSCOPIC
Bilirubin Urine: NEGATIVE
Glucose, UA: NEGATIVE mg/dL
Ketones, ur: NEGATIVE mg/dL
NITRITE: NEGATIVE
PH: 5.5 (ref 5.0–8.0)
Protein, ur: 30 mg/dL — AB
SPECIFIC GRAVITY, URINE: 1.024 (ref 1.005–1.030)

## 2015-09-08 LAB — URINE MICROSCOPIC-ADD ON

## 2015-09-08 MED ORDER — ONDANSETRON 4 MG PO TBDP
4.0000 mg | ORAL_TABLET | Freq: Once | ORAL | Status: AC
  Administered 2015-09-08: 4 mg via ORAL
  Filled 2015-09-08: qty 1

## 2015-09-08 MED ORDER — CIPROFLOXACIN HCL 500 MG PO TABS: 500.0000 mg | ORAL_TABLET | Freq: Two times a day (BID) | ORAL | 0 refills | Status: DC

## 2015-09-08 MED ORDER — AZITHROMYCIN 1 G PO PACK
1.0000 g | PACK | Freq: Once | ORAL | Status: AC
  Administered 2015-09-08: 1 g via ORAL
  Filled 2015-09-08: qty 1

## 2015-09-08 MED ORDER — LIDOCAINE HCL (PF) 1 % IJ SOLN
INTRAMUSCULAR | Status: AC
  Administered 2015-09-08: 5 mL
  Filled 2015-09-08: qty 5

## 2015-09-08 MED ORDER — CEFTRIAXONE SODIUM 250 MG IJ SOLR
250.0000 mg | Freq: Once | INTRAMUSCULAR | Status: AC
  Administered 2015-09-08: 250 mg via INTRAMUSCULAR
  Filled 2015-09-08: qty 250

## 2015-09-08 NOTE — ED Notes (Signed)
Back in room

## 2015-09-08 NOTE — Discharge Instructions (Signed)
You were diagnosed with a right inguinal hernia today. It is important he follow-up with [facility] for reevaluation as this will need surgical repair. He also have evidence of a urinary tract infection, please take your Cipro as prescribed. Follow-up with your doctor next week for reevaluation. Return to ED for new or worsening symptoms as we discussed.

## 2015-09-08 NOTE — ED Provider Notes (Signed)
[facility]-EMERGENCY DEPT Provider Note   CSN: [id number] Arrival date & time: 09/08/15  1130  First Provider Contact:   First MD Initiated Contact with Patient 09/08/15 1310     By signing my name below, I, Rosario Adie, attest that this documentation has been prepared under the direction and in the presence of Joycie Peek, PA-C.  Electronically Signed: Rosario Adie, ED Scribe. 09/08/15. 1:36 PM.  History   Chief Complaint Chief Complaint  Patient presents with  . Groin Pain   The history is provided by the patient. No language interpreter was used.   HPI Comments: Keith Romero is a 50 y.o. male with no pertinent PMHx, who presents to the Emergency Department complaining of a moderate area of swelling that is palpable and self-reducible to his right-sided groin x ~3 days. Pt has had associated mild dysuria and nausea since he noticed the area. He denies any pain to the area with palpation or otherwise. No noted OTC medications tried PTA. Pt denies recent new sexual partners, but is currently sexual active w/ "a friend". Last sexual intercourse was unprotected and ~1 week ago. Pt reports having an STD in the past. No hx of hernias. Denies penile discharge, hematuria, back pain, abdominal pain, scrotal/testicular pain, emesis, or any other symptoms.   History reviewed. No pertinent past medical history.  There are no active problems to display for this patient.  History reviewed. No pertinent surgical history.  Home Medications    Prior to Admission medications   Medication Sig Start Date End Date Taking? Authorizing Provider  ciprofloxacin (CIPRO) 500 MG tablet Take 1 tablet (500 mg total) by mouth 2 (two) times daily. 09/08/15   Joycie Peek, PA-C  nitrofurantoin, macrocrystal-monohydrate, (MACROBID) 100 MG capsule Take 1 capsule (100 mg total) by mouth 2 (two) times daily. 09/25/14   Eyvonne Mechanic, PA-C  omeprazole (PRILOSEC) 20 MG capsule Take 1 capsule (20 mg  total) by mouth daily. 09/25/14   Eyvonne Mechanic, PA-C    Family History No family history on file.  Social History Social History  Substance Use Topics  . Smoking status: Never Smoker  . Smokeless tobacco: Never Used  . Alcohol use Yes   Allergies   Penicillins  Review of Systems Review of Systems A complete 10 system review of systems was obtained and all systems are negative except as noted in the HPI and PMH.   Physical Exam Updated Vital Signs BP 134/82 (BP Location: Right Arm)   Pulse (!) 50   Temp 97.5 F (36.4 C) (Oral)   Resp 16   SpO2 99%   Physical Exam  Constitutional: He appears well-developed and well-nourished.  HENT:  Head: Normocephalic.  Eyes: Conjunctivae are normal.  Cardiovascular: Normal rate.   Pulmonary/Chest: Effort normal. No respiratory distress.  Abdominal: Soft. He exhibits no distension. There is no tenderness.  Large right-sided inguinal hernia. No abdominal pain.   Musculoskeletal: Normal range of motion.  Neurological: He is alert.  Skin: Skin is warm and dry.  Psychiatric: He has a normal mood and affect. His behavior is normal.  Nursing note and vitals reviewed.  ED Treatments / Results  DIAGNOSTIC STUDIES: Oxygen Saturation is 99% on RA, normal by my interpretation.   COORDINATION OF CARE: 1:15 PM-Discussed next steps with pt. Pt verbalized understanding and is agreeable with the plan.   Labs (all labs ordered are listed, but only abnormal results are displayed) Labs Reviewed  URINALYSIS, ROUTINE W REFLEX MICROSCOPIC (NOT AT [facility]) - Abnormal;  Notable for the following:       Result Value   APPearance TURBID (*)    Hgb urine dipstick MODERATE (*)    Protein, ur 30 (*)    Leukocytes, UA LARGE (*)    All other components within normal limits  URINE MICROSCOPIC-ADD ON - Abnormal; Notable for the following:    Squamous Epithelial / LPF 0-5 (*)    Bacteria, UA FEW (*)    Casts HYALINE CASTS (*)    All other components  within normal limits  URINE CULTURE  RPR  HIV ANTIBODY (ROUTINE TESTING)  GC/CHLAMYDIA PROBE AMP ([place]) NOT AT [facility]    EKG  EKG Interpretation None       Radiology US Scrotum  Result Date: 09/08/2015 CLINICAL DATA:  Right groin and scrotal swelling for 3 days. EXAM: SCROTAL ULTRASOUND DOPPLER ULTRASOUND OF THE TESTICLES TECHNIQUE: Complete ultrasound examination of the testicles, epididymis, and other scrotal structures was performed. Color and spectral Doppler ultrasound were also utilized to evaluate blood flow to the testicles. COMPARISON:  None. FINDINGS: Right testicle Measurements: 4.6 x 2.3 x 3.1 cm, within normal limits. No mass or microlithiasis visualized. Left testicle Measurements: 4.6 x 2.3 x 3.0 cm, within normal limits. No mass or microlithiasis visualized. Right epididymis:  Normal in size and appearance. Left epididymis:  Normal in size and appearance. Hydrocele: Bilateral hydroceles are present, right greater than left. Varicocele:  None visualized. Pulsed Doppler interrogation of both testes demonstrates normal low resistance arterial and venous waveforms bilaterally. Peristalsing bowel is evident within the right inguinal canal. IMPRESSION: 1. Peristalsing bowel herniated into the right inguinal canal. 2. Bilateral hydrocele, right greater than left. 3. The testicles are normal bilaterally. 4. Normal color Doppler flow. Normal arterial and venous waveforms to both testicles. 5. 6. These results were called by telephone at the time of interpretation on 09/08/2015 at 3:55 pm to [facility], [place] , who verbally acknowledged these results. Electronically Signed   By: Marin Roberts M.D.   On: 09/08/2015 15:56  [place] Art/ven Flow Abd Pelv Doppler  Result Date: 09/08/2015 CLINICAL DATA:  Right groin and scrotal swelling for 3 days. EXAM: SCROTAL ULTRASOUND DOPPLER ULTRASOUND OF THE TESTICLES TECHNIQUE: Complete ultrasound examination of the testicles, epididymis, and  other scrotal structures was performed. Color and spectral Doppler ultrasound were also utilized to evaluate blood flow to the testicles. COMPARISON:  None. FINDINGS: Right testicle Measurements: 4.6 x 2.3 x 3.1 cm, within normal limits. No mass or microlithiasis visualized. Left testicle Measurements: 4.6 x 2.3 x 3.0 cm, within normal limits. No mass or microlithiasis visualized. Right epididymis:  Normal in size and appearance. Left epididymis:  Normal in size and appearance. Hydrocele: Bilateral hydroceles are present, right greater than left. Varicocele:  None visualized. Pulsed Doppler interrogation of both testes demonstrates normal low resistance arterial and venous waveforms bilaterally. Peristalsing bowel is evident within the right inguinal canal. IMPRESSION: 1. Peristalsing bowel herniated into the right inguinal canal. 2. Bilateral hydrocele, right greater than left. 3. The testicles are normal bilaterally. 4. Normal color Doppler flow. Normal arterial and venous waveforms to both testicles. 5. 6. These results were called by telephone at the time of interpretation on 09/08/2015 at 3:55 pm to [facility], [place] , who verbally acknowledged these results. Electronically Signed   By: Marin Roberts M.D.   On: 09/08/2015 15:56   Procedures Hernia reduction Date/Time: 09/08/2015 3:04 PM Performed by: Joycie Peek Authorized by: Glynn Octave  Consent: Verbal consent obtained. Risks  and benefits: risks, benefits and alternatives were discussed Consent given by: patient Patient understanding: patient states understanding of the procedure being performed Patient consent: the patient's understanding of the procedure matches consent given Procedure consent: procedure consent matches procedure scheduled Relevant documents: relevant documents present and verified Test results: test results available and properly labeled Site marked: the operative site was marked Imaging studies:  imaging studies available Required items: required blood products, implants, devices, and special equipment available Patient identity confirmed: verbally with patient Time out: Immediately prior to procedure a "time out" was called to verify the correct patient, procedure, equipment, support staff and site/side marked as required. Preparation: Patient was prepped and draped in the usual sterile fashion. Local anesthesia used: no  Anesthesia: Local anesthesia used: no  Sedation: Patient sedated: no Patient tolerance: Patient tolerated the procedure well with no immediate complications Comments: Dr. Manus Gunning at bedside to assist w/ procedure.    (including critical care time)  Medications Ordered in ED Medications  cefTRIAXone (ROCEPHIN) injection 250 mg (250 mg Intramuscular Given 09/08/15 1422)  azithromycin (ZITHROMAX) powder 1 g (1 g Oral Given 09/08/15 1425)  ondansetron (ZOFRAN-ODT) disintegrating tablet 4 mg (4 mg Oral Given 09/08/15 1428)  lidocaine (PF) (XYLOCAINE) 1 % injection (5 mLs  Given 09/08/15 1422)     Initial Impression / Assessment and Plan / ED Course  I have reviewed the triage vital signs and the nursing notes.  Pertinent labs & imaging results that were available during my care of the patient were reviewed by me and considered in my medical decision making (see chart for details).  Clinical Course     Patient presents for evaluation of 2 day history of dysuria, groin swelling. On exam, he has a reducibleRight inguinal hernia. However, question possible retained tissue, will obtain scrotal ultrasound. Urinalysis with evidence of UTI, culture obtained. Patient does admit to recent unprotected sex, Pt agreed to emperical tx of STDs.  Discussed with Dr. Manus Gunning. Will treat with Cipro for UTI. Ultrasound of scrotum shows bowel in right scrotum. Patient with recurrent inguinal hernia, easily reducible. Given referral to general surgery for definitive care. Return  precautions discussed. Overall appears well, nontoxic, hemodynamically stable and appropriate for discharge.  Final Clinical Impressions(s) / ED Diagnoses   Final diagnoses:  Reducible inguinal hernia  UTI (lower urinary tract infection)  Possible exposure to STD    New Prescriptions New Prescriptions   CIPROFLOXACIN (CIPRO) 500 MG TABLET    Take 1 tablet (500 mg total) by mouth 2 (two) times daily.   I personally performed the services described in this documentation, which was scribed in my presence. The recorded information has been reviewed and is accurate.     Joycie Peek, PA-C 09/08/15 1604    Glynn Octave, MD 09/08/15 [phone]

## 2015-09-08 NOTE — ED Triage Notes (Signed)
Rt groin pain x 3 days states had a  Lump , hurt to void, denies discharge

## 2015-09-09 LAB — URINE CULTURE: CULTURE: NO GROWTH

## 2016-04-15 ENCOUNTER — Encounter (HOSPITAL_COMMUNITY): Payer: Self-pay | Admitting: Emergency Medicine

## 2016-04-15 ENCOUNTER — Emergency Department (HOSPITAL_COMMUNITY)
Admission: EM | Admit: 2016-04-15 | Discharge: 2016-04-15 | Disposition: A | Discharge status: Home / Self Care | Payer: BLUE CROSS/BLUE SHIELD | Attending: Emergency Medicine | Admitting: Emergency Medicine

## 2016-04-15 DIAGNOSIS — Z0279 Encounter for issue of other medical certificate: Secondary | ICD-10-CM | POA: Insufficient documentation

## 2016-04-15 DIAGNOSIS — Z7689 Persons encountering health services in other specified circumstances: Secondary | ICD-10-CM

## 2016-04-15 NOTE — ED Triage Notes (Signed)
Pt reports he was out of work since Friday because he did not feel well and went back today and states he needs a note for work.

## 2016-04-15 NOTE — Discharge Instructions (Signed)
Please follow up with a primary care provider as needed.

## 2016-04-15 NOTE — ED Provider Notes (Signed)
[facility] DEPT Provider Note   CSN: [id number] Arrival date & time: 04/15/16  [id number]  By signing my name below, I, Majel HomerPeyton Lee, attest that this documentation has been prepared under the direction and in the presence of Francisco Espina, [place]-C . Electronically Signed: Majel HomerPeyton Lee, Scribe. 04/15/2016. 10:18 AM.  History   Chief Complaint Chief Complaint  Patient presents with  . Letter for School/Work   The history is provided by the patient. No language interpreter was used.   HPI Comments: Keith Romero is a 51 y.o. male who presents to the Emergency Department requesting a work note. Pt reports has been "out of work" for the past 5 days due to mild abdominal pain and "not feeling well." He states his pain has now resolved and attempted to return back to work today but was told he needed a note to medically clear him for work. Pt denies any fever, chills, nausea, vomiting, diarrhea, and any other complaints.   History reviewed. No pertinent past medical history.  There are no active problems to display for this patient.  History reviewed. No pertinent surgical history.  Home Medications    Prior to Admission medications   Medication Sig Start Date End Date Taking? Authorizing Provider  ciprofloxacin (CIPRO) 500 MG tablet Take 1 tablet (500 mg total) by mouth 2 (two) times daily. 09/08/15   Joycie PeekBenjamin Cartner, PA-C  nitrofurantoin, macrocrystal-monohydrate, (MACROBID) 100 MG capsule Take 1 capsule (100 mg total) by mouth 2 (two) times daily. 09/25/14   Eyvonne MechanicJeffrey Hedges, PA-C  omeprazole (PRILOSEC) 20 MG capsule Take 1 capsule (20 mg total) by mouth daily. 09/25/14   Eyvonne MechanicJeffrey Hedges, PA-C    Family History No family history on file.  Social History Social History  Substance Use Topics  . Smoking status: Never Smoker  . Smokeless tobacco: Never Used  . Alcohol use Yes   Allergies   Penicillins  Review of Systems Review of Systems  Constitutional: Negative for chills and fever.    Gastrointestinal: Negative for abdominal pain, diarrhea, nausea and vomiting.   Physical Exam Updated Vital Signs BP 124/74 (BP Location: Left Arm)   Pulse 74   Temp 98.7 F (37.1 C) (Oral)   Resp 17   Ht 6' (1.829 m)   Wt 175 lb (79.4 kg)   SpO2 98%   BMI 23.73 kg/m   Physical Exam  Constitutional: He is oriented to person, place, and time. He appears well-developed and well-nourished.  Well appearing  HENT:  Head: Normocephalic.  Nose: Nose normal.  Eyes: EOM are normal.  Neck: Normal range of motion.  Cardiovascular: Normal rate and normal heart sounds.   Pulmonary/Chest: Effort normal and breath sounds normal. No respiratory distress.  Abdominal: Soft. He exhibits no distension. There is no tenderness. There is no rebound and no guarding.  Musculoskeletal: Normal range of motion.  Neurological: He is alert and oriented to person, place, and time.  Psychiatric: He has a normal mood and affect.  Nursing note and vitals reviewed.  ED Treatments / Results  DIAGNOSTIC STUDIES:  Oxygen Saturation is 98% on RA, normal by my interpretation.    COORDINATION OF CARE:  10:04 AM Discussed treatment plan with pt at bedside and pt agreed to plan.  Labs (all labs ordered are listed, but only abnormal results are displayed) Labs Reviewed - No data to display  EKG  EKG Interpretation None       Radiology No results found.  Procedures Procedures (including critical care time)  Medications Ordered  in ED Medications - No data to display  Initial Impression / Assessment and Plan / ED Course  I have reviewed the triage vital signs and the nursing notes.  Pertinent labs & imaging results that were available during my care of the patient were reviewed by me and considered in my medical decision making (see chart for details).     Patient is well-appearing and has no complaints. Patient will be given a note to return to work.  I personally performed the services  described in this documentation, which was scribed in my presence. The recorded information has been reviewed and is accurate.  Final Clinical Impressions(s) / ED Diagnoses   Final diagnoses:  Return to work evaluation    New Prescriptions New Prescriptions   No medications on file     [address] [place], [place] 04/15/16 1020    Raeford Razor, MD 04/20/16 2349

## 2016-04-23 ENCOUNTER — Ambulatory Visit: Payer: Self-pay | Admitting: General Surgery

## 2016-04-23 NOTE — H&P (Signed)
istory of Present Illness Keith Romero(Keith Diclemente MD; 04/23/2016 9:38 AM) The patient is a 51 year old male who presents with an inguinal hernia. The patient is a 51 year old male who is referred by Keith Romero for evaluation of a right inguinal hernia. Patient states he's at that there for approximately 8 months. He states that this is gotten larger. It is causing him some abdominal discomfort. Patient states it does self reduce and he is able to reduce it when lying down. He states he's had no signs or symptoms of incarceration or strangulation. He states it does bother her more when he is up on his feet working. The patient does work where he has to lift approximately 30 pounds at a time. 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     Past Surgical History Keith Romero(Keith R. Shon BatonBrooks, [name]; 04/23/2016 9:25 AM) Oral Surgery   Diagnostic Studies History Keith Romero(Keith Romero, [name]; 04/23/2016 9:25 AM) Colonoscopy  never  Allergies Keith Romero(Keith Romero, [name]; 04/23/2016 9:25 AM) Penicillin G Potassium *PENICILLINS*  Hives.  Medication History Keith Romero(Keith Romero, [name]; 04/23/2016 9:26 AM) No Current Medications Medications Reconciled  Social History Keith Romero(Keith Romero, [name]; 04/23/2016 9:25 AM) Alcohol use  Occasional alcohol use. Caffeine use  Carbonated beverages. No drug use  Tobacco use  Current some day smoker.  Family History Keith Romero(Keith Romero, [name]; 04/23/2016 9:25 AM) Colon Cancer  Father. Diabetes Mellitus  Mother. Hypertension  Brother, Mother. Thyroid problems  Sister.  Other Problems Keith Romero(Keith Romero, [name]; 04/23/2016 9:25 AM) Heart murmur  Inguinal Hernia     Review of Systems Keith Romero(Keith Leverett MD; 04/23/2016 9:36 AM) General Not Present- Fever. Respiratory Not Present- Cough and Difficulty Breathing. Cardiovascular Not Present- Chest Pain, Difficulty Breathing Lying Down, Leg Cramps, Palpitations, Rapid Heart Rate, Shortness of Breath and Swelling of  Extremities. Gastrointestinal Present- Nausea. Not Present- Abdominal Pain, Bloating, Bloody Stool, Change in Bowel Habits, Chronic diarrhea, Constipation, Difficulty Swallowing, Excessive gas, Gets full quickly at meals, Hemorrhoids, Indigestion, Rectal Pain and Vomiting. Musculoskeletal Not Present- Back Pain, Joint Pain, Joint Stiffness, Muscle Pain, Muscle Weakness and Swelling of Extremities. Neurological Not Present- Decreased Memory, Fainting, Headaches, Numbness, Seizures, Tingling, Tremor, Trouble walking and Weakness. Psychiatric Not Present- Anxiety, Bipolar, Change in Sleep Pattern, Depression, Fearful and Frequent crying. Endocrine Not Present- Cold Intolerance, Excessive Hunger, Hair Changes, Heat Intolerance, Hot flashes and New Diabetes. Hematology Not Present- Blood Thinners, Easy Bruising, Excessive bleeding, Gland problems, HIV and Persistent Infections.  Vitals Keith Romero(Keith Romero [name]; 04/23/2016 9:25 AM) 04/23/2016 9:25 AM Weight: 167.25 lb Height: 72in Body Surface Area: 1.97 m Body Mass Index: 22.68 kg/m  Temp.: 97.20F(Oral)  BP: 116/78 (Sitting, Left Arm, Standard)       Physical Exam Keith Romero(Keith Mcgregor, MD; 04/23/2016 9:39 AM) General Mental Status-Alert. General Appearance-Consistent with stated age. Hydration-Well hydrated. Voice-Normal.  Head and Neck Head-normocephalic, atraumatic with no lesions or palpable masses. Trachea-midline.  Eye Eyeball - Bilateral-Extraocular movements intact. Sclera/Conjunctiva - Bilateral-No scleral icterus.  Chest and Lung Exam Chest and lung exam reveals -quiet, even and easy respiratory effort with no use of accessory muscles. Inspection Chest Wall - Normal. Back - normal.  Cardiovascular Cardiovascular examination reveals -normal heart sounds, regular rate and rhythm with no murmurs.  Abdomen Inspection Skin - Scar - no surgical scars. Hernias - Inguinal hernia - Right -  Reducible. Palpation/Percussion Normal exam - Soft, Non Tender, No Rebound tenderness, No Rigidity (guarding) and No hepatosplenomegaly. Auscultation Normal exam - Bowel sounds normal.  Neurologic Neurologic evaluation reveals -alert and oriented x 3 with no impairment of recent or remote memory. Mental Status-Normal.  Musculoskeletal Normal Exam - Left-Upper Extremity Strength Normal and Lower Extremity Strength Normal. Normal Exam - Right-Upper Extremity Strength Normal, Lower Extremity Weakness.    Assessment & Plan Keith Filler MD; 04/23/2016 9:39 AM) RIGHT INGUINAL HERNIA (K40.90) Impression: 51 y/o M with a history of a right/left/bilateral inguinal hernia.  1. The patient will like to proceed to the operating room for laparoscopic right inguinal hernia repair with mesh.  2. I discussed with the patient the signs and symptoms of incarceration and strangulation and the need to proceed to the ER should they occur.  3. I discussed with the patient the risks and benefits of the procedure to include but not limited to: Infection, bleeding, damage to surrounding structures, possible need for further surgery, possible nerve pain, and possible recurrence. The patient was understanding and wishes to proceed.

## 2016-05-21 ENCOUNTER — Emergency Department (HOSPITAL_COMMUNITY)
Admission: EM | Admit: 2016-05-21 | Discharge: 2016-05-21 | Disposition: A | Discharge status: Home / Self Care | Payer: BLUE CROSS/BLUE SHIELD | Attending: Emergency Medicine | Admitting: Emergency Medicine

## 2016-05-21 ENCOUNTER — Encounter (HOSPITAL_COMMUNITY): Payer: Self-pay

## 2016-05-21 DIAGNOSIS — K4090 Unilateral inguinal hernia, without obstruction or gangrene, not specified as recurrent: Secondary | ICD-10-CM | POA: Diagnosis present

## 2016-05-21 LAB — URINALYSIS, ROUTINE W REFLEX MICROSCOPIC
BILIRUBIN URINE: NEGATIVE
Bacteria, UA: NONE SEEN
Glucose, UA: NEGATIVE mg/dL
Hgb urine dipstick: NEGATIVE
KETONES UR: 20 mg/dL — AB
LEUKOCYTES UA: NEGATIVE
NITRITE: NEGATIVE
PH: 5 (ref 5.0–8.0)
Protein, ur: 30 mg/dL — AB
Specific Gravity, Urine: 1.033 — ABNORMAL HIGH (ref 1.005–1.030)

## 2016-05-21 LAB — CBC
HCT: 41.2 % (ref 39.0–52.0)
HEMOGLOBIN: 13.4 g/dL (ref 13.0–17.0)
MCH: 32.1 pg (ref 26.0–34.0)
MCHC: 32.5 g/dL (ref 30.0–36.0)
MCV: 98.8 fL (ref 78.0–100.0)
Platelets: 214 10*3/uL (ref 150–400)
RBC: 4.17 MIL/uL — AB (ref 4.22–5.81)
RDW: 12.6 % (ref 11.5–15.5)
WBC: 8.7 10*3/uL (ref 4.0–10.5)

## 2016-05-21 LAB — COMPREHENSIVE METABOLIC PANEL
ALT: 16 U/L — ABNORMAL LOW (ref 17–63)
ANION GAP: 10 (ref 5–15)
AST: 25 U/L (ref 15–41)
Albumin: 4.6 g/dL (ref 3.5–5.0)
Alkaline Phosphatase: 57 U/L (ref 38–126)
BILIRUBIN TOTAL: 1.4 mg/dL — AB (ref 0.3–1.2)
BUN: 6 mg/dL (ref 6–20)
CALCIUM: 9.1 mg/dL (ref 8.9–10.3)
CO2: 24 mmol/L (ref 22–32)
Chloride: 102 mmol/L (ref 101–111)
Creatinine, Ser: 0.68 mg/dL (ref 0.61–1.24)
Glucose, Bld: 96 mg/dL (ref 65–99)
Potassium: 3.6 mmol/L (ref 3.5–5.1)
SODIUM: 136 mmol/L (ref 135–145)
TOTAL PROTEIN: 7.2 g/dL (ref 6.5–8.1)

## 2016-05-21 LAB — LIPASE, BLOOD: Lipase: 25 U/L (ref 11–51)

## 2016-05-21 MED ORDER — ONDANSETRON 4 MG PO TBDP
4.0000 mg | ORAL_TABLET | Freq: Once | ORAL | Status: AC
  Administered 2016-05-21: 4 mg via ORAL
  Filled 2016-05-21: qty 1

## 2016-05-21 MED ORDER — DOCUSATE SODIUM 100 MG PO CAPS: 100.0000 mg | ORAL_CAPSULE | Freq: Two times a day (BID) | ORAL | 0 refills | Status: DC

## 2016-05-21 NOTE — Discharge Instructions (Signed)
Patient keep your appointment as scheduled tomorrow with [facility] [place] [facility].  You also have been given a prescription for Colace to help keep her stools soft take it as follows 1 tablet twice a day until you.  Stools are mushy and then back off to one a day

## 2016-05-21 NOTE — ED Provider Notes (Signed)
[facility]PT Provider Note   CSN: [id number] Arrival date & time: 05/21/16  7829     History   Chief Complaint Chief Complaint  Patient presents with  . Hernia    HPI Estil Vallee is a 51 y.o. male.  This a 51 year old male with known right inguinal hernia who was supposed to have repair done in July 2017, but somehow failed to follow-up.  He called [facility] today and does have an appointment tomorrow for evaluation.  He states that when he becomes "constipated, his hernia sticks out more" today he felt it was out a little bit more than normal.  He took an enema and this reduced it significantly, but he is having some discomfort in that area at this time.      History reviewed. No pertinent past medical history.  There are no active problems to display for this patient.   History reviewed. No pertinent surgical history.     Home Medications    Prior to Admission medications   Medication Sig Start Date End Date Taking? Authorizing Provider  ciprofloxacin (CIPRO) 500 MG tablet Take 1 tablet (500 mg total) by mouth 2 (two) times daily. 09/08/15   Joycie Peek, PA-C  docusate sodium (COLACE) 100 MG capsule Take 1 capsule (100 mg total) by mouth every 12 (twelve) hours. Start with 1 tablet twice a day until stools are soft and then back off to one tablet daily 05/21/16   Earley Favor, NP  nitrofurantoin, macrocrystal-monohydrate, (MACROBID) 100 MG capsule Take 1 capsule (100 mg total) by mouth 2 (two) times daily. 09/25/14   Eyvonne Mechanic, PA-C  omeprazole (PRILOSEC) 20 MG capsule Take 1 capsule (20 mg total) by mouth daily. 09/25/14   Eyvonne Mechanic, PA-C    Family History No family history on file.  Social History Social History  Substance Use Topics  . Smoking status: Never Smoker  . Smokeless tobacco: Never Used  . Alcohol use Yes     Allergies   Penicillins   Review of Systems Review of Systems  Constitutional: Negative for fever.   Gastrointestinal: Positive for constipation and nausea. Negative for vomiting.  Genitourinary: Negative for dysuria.  All other systems reviewed and are negative.    Physical Exam Updated Vital Signs BP 127/84   Pulse (!) 56   Temp 98.4 F (36.9 C) (Oral)   Resp 18   SpO2 (!) 89%   Physical Exam  Constitutional: He appears well-developed and well-nourished.  HENT:  Head: Normocephalic.  Eyes: Pupils are equal, round, and reactive to light.  Neck: Normal range of motion.  Cardiovascular: Normal rate.   Pulmonary/Chest: Effort normal.  Abdominal: He exhibits no distension. There is no tenderness. A hernia is present. Hernia confirmed positive in the right inguinal area.  She has a large right inguinal hernia with a large defect hernia is easily reduced with no erythema or discoloration of the abdominal wall.  Musculoskeletal: Normal range of motion.  Neurological: He is alert.  Skin: Skin is warm.  Nursing note and vitals reviewed.    ED Treatments / Results  Labs (all labs ordered are listed, but only abnormal results are displayed) Labs Reviewed  COMPREHENSIVE METABOLIC PANEL - Abnormal; Notable for the following:       Result Value   ALT 16 (*)    Total Bilirubin 1.4 (*)    All other components within normal limits  CBC - Abnormal; Notable for the following:    RBC 4.17 (*)  All other components within normal limits  URINALYSIS, ROUTINE W REFLEX MICROSCOPIC - Abnormal; Notable for the following:    APPearance HAZY (*)    Specific Gravity, Urine 1.033 (*)    Ketones, ur 20 (*)    Protein, ur 30 (*)    Squamous Epithelial / LPF 0-5 (*)    All other components within normal limits  LIPASE, BLOOD    EKG  EKG Interpretation None       Radiology No results found.  Procedures Procedures (including critical care time)  Medications Ordered in ED Medications  ondansetron (ZOFRAN-ODT) disintegrating tablet 4 mg (4 mg Oral Given 05/21/16 2247)      Initial Impression / Assessment and Plan / ED Course  I have reviewed the triage vital signs and the nursing notes.  Pertinent labs & imaging results that were available during my care of the patient were reviewed by me and considered in my medical decision making (see chart for details).   patient was given a prescription for Colace to help keep his stools soft.  He was given 1 dose of Zofran in the emergency department because he states that "stomach is upset."  But no episodes of vomiting or diarrhea.  He did have a large formed bowel movement after giving himself an enema earlier today. Hernia is easily reduced.  There is quite a large defect, so I doubt at any point has it been incarcerated.  He does have an appointment with [facility] surgery tomorrow, which she's been encouraged to keep.    Final Clinical Impressions(s) / ED Diagnoses   Final diagnoses:  Right inguinal hernia    New Prescriptions Discharge Medication List as of 05/21/2016 10:41 PM    START taking these medications   Details  docusate sodium (COLACE) 100 MG capsule Take 1 capsule (100 mg total) by mouth every 12 (twelve) hours. Start with 1 tablet twice a day until stools are soft and then back off to one tablet daily, Starting Thu 05/21/2016, Print         Earley Favor, NP 05/21/16 2245    Earley Favor, NP 05/22/16 1958    Arby Barrette, MD 05/25/16 1057

## 2016-05-21 NOTE — ED Triage Notes (Signed)
Pt reports right groin pain and was told he has a hernia, diagnosed two months ago. No date scheduled yet for surgery.

## 2016-08-05 ENCOUNTER — Encounter (HOSPITAL_COMMUNITY): Payer: Self-pay

## 2016-08-05 DIAGNOSIS — K5900 Constipation, unspecified: Secondary | ICD-10-CM | POA: Insufficient documentation

## 2016-08-05 DIAGNOSIS — Z7902 Long term (current) use of antithrombotics/antiplatelets: Secondary | ICD-10-CM | POA: Insufficient documentation

## 2016-08-05 LAB — COMPREHENSIVE METABOLIC PANEL
ALK PHOS: 60 U/L (ref 38–126)
ALT: 14 U/L — ABNORMAL LOW (ref 17–63)
ANION GAP: 8 (ref 5–15)
AST: 22 U/L (ref 15–41)
Albumin: 4.5 g/dL (ref 3.5–5.0)
BUN: 5 mg/dL — ABNORMAL LOW (ref 6–20)
CALCIUM: 9.4 mg/dL (ref 8.9–10.3)
CO2: 32 mmol/L (ref 22–32)
Chloride: 99 mmol/L — ABNORMAL LOW (ref 101–111)
Creatinine, Ser: 0.77 mg/dL (ref 0.61–1.24)
GFR calc non Af Amer: >60 mL/min (ref >60)
Glucose, Bld: 115 mg/dL — ABNORMAL HIGH (ref 65–99)
POTASSIUM: 4.1 mmol/L (ref 3.5–5.1)
Sodium: 139 mmol/L (ref 135–145)
Total Bilirubin: 0.9 mg/dL (ref 0.3–1.2)
Total Protein: 7.2 g/dL (ref 6.5–8.1)

## 2016-08-05 LAB — URINALYSIS, ROUTINE W REFLEX MICROSCOPIC
BACTERIA UA: NONE SEEN
BILIRUBIN URINE: NEGATIVE
GLUCOSE, UA: NEGATIVE mg/dL
Hgb urine dipstick: NEGATIVE
KETONES UR: 5 mg/dL — AB
Leukocytes, UA: NEGATIVE
Nitrite: NEGATIVE
PROTEIN: 30 mg/dL — AB
Specific Gravity, Urine: 1.025 (ref 1.005–1.030)
pH: 8 (ref 5.0–8.0)

## 2016-08-05 LAB — CBC
HEMATOCRIT: 40.9 % (ref 39.0–52.0)
HEMOGLOBIN: 13.4 g/dL (ref 13.0–17.0)
MCH: 32.8 pg (ref 26.0–34.0)
MCHC: 32.8 g/dL (ref 30.0–36.0)
MCV: 100.2 fL — ABNORMAL HIGH (ref 78.0–100.0)
Platelets: 215 10*3/uL (ref 150–400)
RBC: 4.08 MIL/uL — AB (ref 4.22–5.81)
RDW: 12.6 % (ref 11.5–15.5)
WBC: 7.6 10*3/uL (ref 4.0–10.5)

## 2016-08-05 LAB — LIPASE, BLOOD: Lipase: 31 U/L (ref 11–51)

## 2016-08-05 NOTE — ED Triage Notes (Signed)
Pt states that he has a inguinal hernia on his testicles. Pt states that he has been constipated since yesterday with abd pain and took two bottles of mag citrate without relief. Vomited x 1 earlier

## 2016-08-06 ENCOUNTER — Emergency Department (HOSPITAL_COMMUNITY)
Admission: EM | Admit: 2016-08-06 | Discharge: 2016-08-06 | Disposition: A | Discharge status: Home / Self Care | Payer: BLUE CROSS/BLUE SHIELD | Attending: Emergency Medicine | Admitting: Emergency Medicine

## 2016-08-06 DIAGNOSIS — K5900 Constipation, unspecified: Secondary | ICD-10-CM

## 2016-08-06 DIAGNOSIS — K4090 Unilateral inguinal hernia, without obstruction or gangrene, not specified as recurrent: Secondary | ICD-10-CM

## 2016-08-06 NOTE — ED Provider Notes (Signed)
[facility] Provider Note   CSN: [id number] Arrival date & time: 08/05/16  2040  By signing my name below, I, Diona Browner, attest that this documentation has been prepared under the direction and in the presence of Devoria Albe, MD. Electronically Signed: Diona Browner, ED Scribe. 08/06/16. 3:46 AM.  TIME SEEN: 03:38  History   Chief Complaint Chief Complaint  Patient presents with  . Inguinal Hernia  . Constipation    HPI Keith Romero is a 51 y.o. male who presents to the Emergency Department with a chief complaint of constipation that started the morning of 08/05/16. Constipation aggravated his right inguinal hernia that he has had since August 2017. Pt took magnesium citrate ~ 11:30 am on 08/05/16 with no relief. He also took colace and drank prune juice with mild to no relief. Associated sx include mild abdominal pain, nausea, vomiting back up the magnesium citrate and diaphoresis while nauseated. Pt states he is no longer in pain after he had 2 BM's while waiting to be seen in the ED waiting room. His hernia has gone back in as well. Pt has surgery scheduled on Monday, July 2 for his hernia. Pt denies fever.  PCP: Patient, No Pcp Per - will be referred after his surgery. Surgery Dr. Derrell Lolling  The history is provided by the patient. No language interpreter was used.    History reviewed. No pertinent past medical history.  There are no active problems to display for this patient.   History reviewed. No pertinent surgical history.     Home Medications    Prior to Admission medications   Medication Sig Start Date End Date Taking? Authorizing Provider  ciprofloxacin (CIPRO) 500 MG tablet Take 1 tablet (500 mg total) by mouth 2 (two) times daily. 09/08/15   Cartner, Sharlet Salina, PA-C  docusate sodium (COLACE) 100 MG capsule Take 1 capsule (100 mg total) by mouth every 12 (twelve) hours. Start with 1 tablet twice a day until stools are soft and then back off to one tablet  daily 05/21/16   Earley Favor, NP  nitrofurantoin, macrocrystal-monohydrate, (MACROBID) 100 MG capsule Take 1 capsule (100 mg total) by mouth 2 (two) times daily. 09/25/14   Hedges, Tinnie Gens, PA-C  omeprazole (PRILOSEC) 20 MG capsule Take 1 capsule (20 mg total) by mouth daily. 09/25/14   Eyvonne Mechanic, PA-C    Family History No family history on file.  Social History Social History  Substance Use Topics  . Smoking status: Never Smoker  . Smokeless tobacco: Never Used  . Alcohol use Yes  employed driving school buses   Allergies   Penicillins   Review of Systems Review of Systems  Constitutional: Negative for fever.  Gastrointestinal: Positive for abdominal pain, constipation, nausea and vomiting.  All other systems reviewed and are negative.    Physical Exam Updated Vital Signs BP 139/87   Pulse 68   Temp 98.4 F (36.9 C) (Oral)   Resp 19   SpO2 99%   Physical Exam  Constitutional: He is oriented to person, place, and time. He appears well-developed and well-nourished.  Non-toxic appearance. He does not appear ill. No distress.  HENT:  Head: Normocephalic and atraumatic.  Nose: No rhinorrhea.  Eyes: Conjunctivae and EOM are normal.  Neck: Full passive range of motion without pain. Neck supple.  Cardiovascular: Normal rate.   Pulmonary/Chest: Effort normal. No respiratory distress. He has no rhonchi. He exhibits no crepitus.  Abdominal: Soft. Normal appearance and bowel sounds are normal. He exhibits no distension.  There is no tenderness. There is no rebound and no guarding.  Genitourinary:  Genitourinary Comments: No abnormal swelling in left scrotum or left inguinal area. Right groin mild bulging with open inguinal ring but no distinct mass. Scrotum is also without swelling.  Musculoskeletal: Normal range of motion.  Moves all extremities well.   Neurological: He is alert and oriented to person, place, and time. He has normal strength. No cranial nerve deficit.    Skin: Skin is warm, dry and intact. No rash noted. No erythema. No pallor.  Psychiatric: He has a normal mood and affect. His speech is normal and behavior is normal. His mood appears not anxious.  Nursing note and vitals reviewed.    ED Treatments / Results  DIAGNOSTIC STUDIES: Oxygen Saturation is 100% on RA, normal by my interpretation.   Labs (all labs ordered are listed, but only abnormal results are displayed) Results for orders placed or performed during the hospital encounter of 08/06/16  Lipase, blood  Result Value Ref Range   Lipase 31 11 - 51 U/L  Comprehensive metabolic panel  Result Value Ref Range   Sodium 139 135 - 145 mmol/L   Potassium 4.1 3.5 - 5.1 mmol/L   Chloride 99 (L) 101 - 111 mmol/L   CO2 32 22 - 32 mmol/L   Glucose, Bld 115 (H) 65 - 99 mg/dL   BUN 5 (L) 6 - 20 mg/dL   Creatinine, Ser 6.960.77 0.61 - 1.24 mg/dL   Calcium 9.4 8.9 - 29.510.3 mg/dL   Total Protein 7.2 6.5 - 8.1 g/dL   Albumin 4.5 3.5 - 5.0 g/dL   AST 22 15 - 41 U/L   ALT 14 (L) 17 - 63 U/L   Alkaline Phosphatase 60 38 - 126 U/L   Total Bilirubin 0.9 0.3 - 1.2 mg/dL   GFR calc non Af Amer >60 >60 mL/min   GFR calc Af Amer >60 >60 mL/min   Anion gap 8 5 - 15  CBC  Result Value Ref Range   WBC 7.6 4.0 - 10.5 K/uL   RBC 4.08 (L) 4.22 - 5.81 MIL/uL   Hemoglobin 13.4 13.0 - 17.0 g/dL   HCT 28.440.9 13.239.0 - 44.052.0 %   MCV 100.2 (H) 78.0 - 100.0 fL   MCH 32.8 26.0 - 34.0 pg   MCHC 32.8 30.0 - 36.0 g/dL   RDW 10.212.6 72.511.5 - 36.615.5 %   Platelets 215 150 - 400 K/uL  Urinalysis, Routine w reflex microscopic  Result Value Ref Range   Color, Urine YELLOW YELLOW   APPearance CLOUDY (A) CLEAR   Specific Gravity, Urine 1.025 1.005 - 1.030   pH 8.0 5.0 - 8.0   Glucose, UA NEGATIVE NEGATIVE mg/dL   Hgb urine dipstick NEGATIVE NEGATIVE   Bilirubin Urine NEGATIVE NEGATIVE   Ketones, ur 5 (A) NEGATIVE mg/dL   Protein, ur 30 (A) NEGATIVE mg/dL   Nitrite NEGATIVE NEGATIVE   Leukocytes, UA NEGATIVE NEGATIVE    RBC / HPF 0-5 0 - 5 RBC/hpf   WBC, UA 0-5 0 - 5 WBC/hpf   Bacteria, UA NONE SEEN NONE SEEN   Squamous Epithelial / LPF 0-5 (A) NONE SEEN   Mucous PRESENT    Amorphous Crystal PRESENT    Laboratory interpretation all normal     Procedures Procedures (including critical care time)  Medications Ordered in ED Medications - No data to display   Initial Impression / Assessment and Plan / ED Course  I have reviewed the triage vital  signs and the nursing notes.  Pertinent labs & imaging results that were available during my care of the patient were reviewed by me and considered in my medical decision making (see chart for details).    COORDINATION OF CARE: 3:46 AM-Discussed next steps with pt. Pt verbalized understanding and is agreeable with the plan.Patient's symptoms improved with bowel movement 2 well waiting in the ED. We discussed further treatment of his constipation until he can have his surgery in 3 days. He can increase his Colace back up to 3 times a day or he can take MiraLAX encounter twice a day.   Final Clinical Impressions(s) / ED Diagnoses   Final diagnoses:  Constipation, unspecified constipation type  Inguinal hernia, right    New Prescriptions OTC colace or miralax   Plan discharge  Devoria Albe, MD, FACEP   I personally performed the services described in this documentation, which was scribed in my presence. The recorded information has been reviewed and considered.  Devoria Albe, MD, Concha Pyo, MD 08/06/16 [phone]

## 2016-08-06 NOTE — Discharge Instructions (Signed)
You can go back onto the colace 3 times a day or try miralax twice a day for constipation. Keep your appointment on Monday, July 2 for your inguinal hernia repair surgery. Return to the ED if you get worse again.

## 2016-11-04 ENCOUNTER — Encounter (HOSPITAL_COMMUNITY): Payer: Self-pay | Admitting: Emergency Medicine

## 2016-11-04 ENCOUNTER — Emergency Department (HOSPITAL_COMMUNITY)
Admission: EM | Admit: 2016-11-04 | Discharge: 2016-11-04 | Disposition: A | Discharge status: Home / Self Care | Payer: BLUE CROSS/BLUE SHIELD | Attending: Emergency Medicine | Admitting: Emergency Medicine

## 2016-11-04 DIAGNOSIS — K4090 Unilateral inguinal hernia, without obstruction or gangrene, not specified as recurrent: Secondary | ICD-10-CM

## 2016-11-04 DIAGNOSIS — Z79899 Other long term (current) drug therapy: Secondary | ICD-10-CM | POA: Diagnosis not present

## 2016-11-04 DIAGNOSIS — R110 Nausea: Secondary | ICD-10-CM | POA: Diagnosis present

## 2016-11-04 LAB — URINALYSIS, ROUTINE W REFLEX MICROSCOPIC
Bilirubin Urine: NEGATIVE
Glucose, UA: NEGATIVE mg/dL
Hgb urine dipstick: NEGATIVE
Ketones, ur: 80 mg/dL — AB
Leukocytes, UA: NEGATIVE
Nitrite: NEGATIVE
Protein, ur: 30 mg/dL — AB
Specific Gravity, Urine: 1.029 (ref 1.005–1.030)
pH: 5 (ref 5.0–8.0)

## 2016-11-04 LAB — COMPREHENSIVE METABOLIC PANEL
ALK PHOS: 62 U/L (ref 38–126)
ALT: 15 U/L — ABNORMAL LOW (ref 17–63)
ANION GAP: 8 (ref 5–15)
AST: 25 U/L (ref 15–41)
Albumin: 4.7 g/dL (ref 3.5–5.0)
BUN: <5 mg/dL — ABNORMAL LOW (ref 6–20)
CALCIUM: 9.4 mg/dL (ref 8.9–10.3)
CHLORIDE: 105 mmol/L (ref 101–111)
CO2: 25 mmol/L (ref 22–32)
CREATININE: 0.74 mg/dL (ref 0.61–1.24)
Glucose, Bld: 97 mg/dL (ref 65–99)
Potassium: 3.8 mmol/L (ref 3.5–5.1)
Sodium: 138 mmol/L (ref 135–145)
Total Bilirubin: 2.1 mg/dL — ABNORMAL HIGH (ref 0.3–1.2)
Total Protein: 7.9 g/dL (ref 6.5–8.1)

## 2016-11-04 LAB — CBC
HCT: 43.8 % (ref 39.0–52.0)
HEMOGLOBIN: 14.5 g/dL (ref 13.0–17.0)
MCH: 32.7 pg (ref 26.0–34.0)
MCHC: 33.1 g/dL (ref 30.0–36.0)
MCV: 98.6 fL (ref 78.0–100.0)
PLATELETS: 215 10*3/uL (ref 150–400)
RBC: 4.44 MIL/uL (ref 4.22–5.81)
RDW: 12.6 % (ref 11.5–15.5)
WBC: 8.9 10*3/uL (ref 4.0–10.5)

## 2016-11-04 LAB — LIPASE, BLOOD: LIPASE: 24 U/L (ref 11–51)

## 2016-11-04 MED ORDER — PROMETHAZINE HCL 25 MG PO TABS: 25.0000 mg | ORAL_TABLET | Freq: Four times a day (QID) | ORAL | 0 refills | Status: DC | PRN

## 2016-11-04 MED ORDER — PROMETHAZINE HCL 25 MG/ML IJ SOLN
25.0000 mg | Freq: Once | INTRAMUSCULAR | Status: AC
  Administered 2016-11-04: 25 mg via INTRAVENOUS
  Filled 2016-11-04: qty 1

## 2016-11-04 MED ORDER — POLYETHYLENE GLYCOL 3350 17 G PO PACK: 17.0000 g | PACK | Freq: Every day | ORAL | 0 refills | Status: DC

## 2016-11-04 MED ORDER — ONDANSETRON 4 MG PO TBDP
4.0000 mg | ORAL_TABLET | Freq: Once | ORAL | Status: AC | PRN
  Administered 2016-11-04: 4 mg via ORAL

## 2016-11-04 MED ORDER — ONDANSETRON 4 MG PO TBDP
ORAL_TABLET | ORAL | Status: AC
  Filled 2016-11-04: qty 1

## 2016-11-04 MED ORDER — HYDROMORPHONE HCL 1 MG/ML IJ SOLN
0.5000 mg | Freq: Once | INTRAMUSCULAR | Status: AC
  Administered 2016-11-04: 0.5 mg via INTRAVENOUS
  Filled 2016-11-04: qty 1

## 2016-11-04 MED ORDER — SODIUM CHLORIDE 0.9 % IV BOLUS (SEPSIS)
1000.0000 mL | Freq: Once | INTRAVENOUS | Status: AC
  Administered 2016-11-04: 1000 mL via INTRAVENOUS

## 2016-11-04 NOTE — ED Notes (Signed)
Pt diaphoretic in triage 

## 2016-11-04 NOTE — ED Provider Notes (Signed)
[facility] Provider Note   CSN: [id number] Arrival date & time: 11/04/16  0145     History   Chief Complaint Chief Complaint  Patient presents with  . Nausea    HPI Keith Romero is a 51 y.o. male.  HPI   51 year old male presenting for evaluation of nausea. Patient with history of right inguinal hernia diagnosed 8 months ago presenting with mental nausea. Since yesterday, patient felt nauseous, did vomit twice of fluid content. He mentioned he is been feeling constipated for the past day and also having a right-sided inguinal hernia that is difficult to reduce. Initially he was complaining of sharp pain which has since improved. Still endorsed mild nausea. He has tried taking Dulcolax at home without adequate relief. He has had difficulty with his right inguinal hernia but able to reduce it with some direct pressure. He has been seen by a surgeon, and is currently trying to save money to have his elective surgery. He currently denies having any fever, worsening abdominal pain, dysuria. He is able to pass flatus.  History reviewed. No pertinent past medical history.  There are no active problems to display for this patient.   History reviewed. No pertinent surgical history.     Home Medications    Prior to Admission medications   Medication Sig Start Date End Date Taking? Authorizing Provider  ciprofloxacin (CIPRO) 500 MG tablet Take 1 tablet (500 mg total) by mouth 2 (two) times daily. 09/08/15   Cartner, Sharlet Salina, PA-C  docusate sodium (COLACE) 100 MG capsule Take 1 capsule (100 mg total) by mouth every 12 (twelve) hours. Start with 1 tablet twice a day until stools are soft and then back off to one tablet daily 05/21/16   Earley Favor, NP  nitrofurantoin, macrocrystal-monohydrate, (MACROBID) 100 MG capsule Take 1 capsule (100 mg total) by mouth 2 (two) times daily. 09/25/14   Hedges, Tinnie Gens, PA-C  omeprazole (PRILOSEC) 20 MG capsule Take 1 capsule (20 mg total) by  mouth daily. 09/25/14   Eyvonne Mechanic, PA-C    Family History No family history on file.  Social History Social History  Substance Use Topics  . Smoking status: Never Smoker  . Smokeless tobacco: Never Used  . Alcohol use Yes     Allergies   Penicillins   Review of Systems Review of Systems  All other systems reviewed and are negative.    Physical Exam Updated Vital Signs BP 133/79 (BP Location: Right Arm)   Pulse (!) 54   Temp 98 F (36.7 C) (Oral)   Resp 14   Ht 6' (1.829 m)   Wt 86.2 kg (190 lb)   SpO2 100%   BMI 25.77 kg/m   Physical Exam  Constitutional: He appears well-developed and well-nourished. No distress.  HENT:  Head: Atraumatic.  Eyes: Conjunctivae are normal.  Neck: Neck supple.  Cardiovascular: Normal rate and regular rhythm.   Pulmonary/Chest: Effort normal and breath sounds normal.  Abdominal: Soft. Bowel sounds are normal. He exhibits no distension. There is no tenderness.  Genitourinary:  Genitourinary Comments: Chaperone present during exam. Large right inguinal hernia noted with mild tenderness to palpation. Difficult to reduce.  Neurological: He is alert.  Skin: No rash noted.  Psychiatric: He has a normal mood and affect.  Nursing note and vitals reviewed.    ED Treatments / Results  Labs (all labs ordered are listed, but only abnormal results are displayed) Labs Reviewed  COMPREHENSIVE METABOLIC PANEL - Abnormal; Notable for the following:  Result Value   BUN <5 (*)    ALT 15 (*)    Total Bilirubin 2.1 (*)    All other components within normal limits  URINALYSIS, ROUTINE W REFLEX MICROSCOPIC - Abnormal; Notable for the following:    Color, Urine AMBER (*)    Ketones, ur 80 (*)    Protein, ur 30 (*)    Bacteria, UA RARE (*)    Squamous Epithelial / LPF 0-5 (*)    All other components within normal limits  LIPASE, BLOOD  CBC    EKG  EKG Interpretation None       Radiology No results  found.  Procedures Hernia reduction Date/Time: 11/04/2016 8:58 AM Performed by: Fayrene Helper Authorized by: Fayrene Helper  Consent: Verbal consent obtained. Risks and benefits: risks, benefits and alternatives were discussed Consent given by: patient Patient understanding: patient states understanding of the procedure being performed Patient consent: the patient's understanding of the procedure matches consent given Patient identity confirmed: verbally with patient and arm band Time out: Immediately prior to procedure a "time out" was called to verify the correct patient, procedure, equipment, support staff and site/side marked as required. Local anesthesia used: no  Anesthesia: Local anesthesia used: no  Sedation: Patient sedated: no Patient tolerance: Patient tolerated the procedure well with no immediate complications Comments: R inguinal hernia reduced using direct pressure with successful reduction.  Pt tolerates well.  Pain medication given.     (including critical care time)  Medications Ordered in ED Medications  ondansetron (ZOFRAN-ODT) 4 MG disintegrating tablet (not administered)  ondansetron (ZOFRAN-ODT) disintegrating tablet 4 mg (4 mg Oral Given 11/04/16 0157)  sodium chloride 0.9 % bolus 1,000 mL (0 mLs Intravenous Stopped 11/04/16 1003)  promethazine (PHENERGAN) injection 25 mg (25 mg Intravenous Given 11/04/16 0846)  HYDROmorphone (DILAUDID) injection 0.5 mg (0.5 mg Intravenous Given 11/04/16 0846)     Initial Impression / Assessment and Plan / ED Course  I have reviewed the triage vital signs and the nursing notes.  Pertinent labs & imaging results that were available during my care of the patient were reviewed by me and considered in my medical decision making (see chart for details).     BP 130/76 (BP Location: Right Arm)   Pulse 77   Temp 98.5 F (36.9 C) (Oral)   Resp 18   Ht 6' (1.829 m)   Wt 86.2 kg (190 lb)   SpO2 100%   BMI 25.77 kg/m    Final  Clinical Impressions(s) / ED Diagnoses   Final diagnoses:  Reducible right inguinal hernia    New Prescriptions New Prescriptions   POLYETHYLENE GLYCOL (MIRALAX / GLYCOLAX) PACKET    Take 17 g by mouth daily.   PROMETHAZINE (PHENERGAN) 25 MG TABLET    Take 1 tablet (25 mg total) by mouth every 6 (six) hours as needed for nausea or vomiting.   7:53 AM Patient with history of recurrent right inguinal hernia here with increased nausea and having difficulty reducing his hernia. I did attempt to apply pressure but patient did not tolerate. He prefers to self reduce it at this time. Will give IV fluid, some antinausea medication and will reassess.  9:00 AM After pain medication were given, I was able to reduce hernia with direct pressure.  Pt tolerates well.  IVF given as pt is dehydrated, with UA showing >80 ketone.    10:13 AM Patient received IV fluids, felt much better. He is comfortable going home and will follow closely with  surgeon for further management. Workup provided. Recommend avoid heavy lifting more than 20 pounds, wearing supportive underwear and return precaution discussed. Antinausea medication prescribed.   Fayrene Helper, PA-C 11/06/16 1637    Melene Plan, DO 11/06/16 1859

## 2016-11-04 NOTE — ED Notes (Signed)
Pt reports emesis and constipation onset this AM. Pt also reports lower abd pain that he states is r/t his hernia, his main concern is the emesis

## 2016-11-27 ENCOUNTER — Encounter (HOSPITAL_COMMUNITY): Payer: Self-pay | Admitting: Orthopedic Surgery

## 2016-11-27 ENCOUNTER — Encounter (HOSPITAL_COMMUNITY): Admission: EM | Disposition: A | Discharge status: Home / Self Care | Payer: Self-pay | Source: Home / Self Care

## 2016-11-27 ENCOUNTER — Emergency Department (HOSPITAL_COMMUNITY): Payer: BLUE CROSS/BLUE SHIELD | Admitting: Certified Registered Nurse Anesthetist

## 2016-11-27 ENCOUNTER — Inpatient Hospital Stay (HOSPITAL_COMMUNITY)
Admission: EM | Admit: 2016-11-27 | Discharge: 2016-12-02 | DRG: 351 | Disposition: A | Discharge status: Home / Self Care | Payer: BLUE CROSS/BLUE SHIELD | Attending: Surgery | Admitting: Surgery

## 2016-11-27 DIAGNOSIS — K567 Ileus, unspecified: Secondary | ICD-10-CM | POA: Diagnosis not present

## 2016-11-27 DIAGNOSIS — F17290 Nicotine dependence, other tobacco product, uncomplicated: Secondary | ICD-10-CM | POA: Diagnosis present

## 2016-11-27 DIAGNOSIS — D62 Acute posthemorrhagic anemia: Secondary | ICD-10-CM | POA: Diagnosis not present

## 2016-11-27 DIAGNOSIS — Z23 Encounter for immunization: Secondary | ICD-10-CM | POA: Diagnosis not present

## 2016-11-27 DIAGNOSIS — Z880 Allergy status to penicillin: Secondary | ICD-10-CM | POA: Diagnosis not present

## 2016-11-27 DIAGNOSIS — K4090 Unilateral inguinal hernia, without obstruction or gangrene, not specified as recurrent: Secondary | ICD-10-CM | POA: Diagnosis present

## 2016-11-27 HISTORY — PX: EXPLORATORY LAPAROTOMY: SUR591

## 2016-11-27 HISTORY — PX: INGUINAL HERNIA REPAIR: SHX194

## 2016-11-27 HISTORY — PX: LAPAROTOMY: SHX154

## 2016-11-27 HISTORY — PX: INSERTION OF MESH: SHX5868

## 2016-11-27 HISTORY — DX: Cardiac murmur, unspecified: R01.1

## 2016-11-27 HISTORY — PX: LAPAROSCOPY: SHX197

## 2016-11-27 HISTORY — PX: INGUINAL HERNIA REPAIR: SUR1180

## 2016-11-27 LAB — CBC
HCT: 39 % (ref 39.0–52.0)
HEMATOCRIT: 40.7 % (ref 39.0–52.0)
Hemoglobin: 12.5 g/dL — ABNORMAL LOW (ref 13.0–17.0)
Hemoglobin: 13.4 g/dL (ref 13.0–17.0)
MCH: 32.6 pg (ref 26.0–34.0)
MCH: 32.9 pg (ref 26.0–34.0)
MCHC: 32.1 g/dL (ref 30.0–36.0)
MCHC: 32.9 g/dL (ref 30.0–36.0)
MCV: 100 fL (ref 78.0–100.0)
MCV: 101.6 fL — ABNORMAL HIGH (ref 78.0–100.0)
PLATELETS: 212 10*3/uL (ref 150–400)
Platelets: 200 10*3/uL (ref 150–400)
RBC: 3.84 MIL/uL — ABNORMAL LOW (ref 4.22–5.81)
RBC: 4.07 MIL/uL — ABNORMAL LOW (ref 4.22–5.81)
RDW: 12.5 % (ref 11.5–15.5)
RDW: 12.7 % (ref 11.5–15.5)
WBC: 15.1 10*3/uL — ABNORMAL HIGH (ref 4.0–10.5)
WBC: 7.4 10*3/uL (ref 4.0–10.5)

## 2016-11-27 LAB — URINALYSIS, ROUTINE W REFLEX MICROSCOPIC
BILIRUBIN URINE: NEGATIVE
Glucose, UA: NEGATIVE mg/dL
Hgb urine dipstick: NEGATIVE
KETONES UR: NEGATIVE mg/dL
LEUKOCYTES UA: NEGATIVE
Nitrite: NEGATIVE
PH: 7 (ref 5.0–8.0)
Protein, ur: NEGATIVE mg/dL
SPECIFIC GRAVITY, URINE: 1.02 (ref 1.005–1.030)

## 2016-11-27 LAB — COMPREHENSIVE METABOLIC PANEL
ALBUMIN: 4.3 g/dL (ref 3.5–5.0)
ALT: 15 U/L — ABNORMAL LOW (ref 17–63)
AST: 25 U/L (ref 15–41)
Alkaline Phosphatase: 65 U/L (ref 38–126)
Anion gap: 9 (ref 5–15)
BUN: 8 mg/dL (ref 6–20)
CO2: 27 mmol/L (ref 22–32)
Calcium: 9.3 mg/dL (ref 8.9–10.3)
Chloride: 103 mmol/L (ref 101–111)
Creatinine, Ser: 0.71 mg/dL (ref 0.61–1.24)
GFR calc Af Amer: >60 mL/min (ref >60)
GFR calc non Af Amer: >60 mL/min (ref >60)
GLUCOSE: 109 mg/dL — AB (ref 65–99)
POTASSIUM: 4.1 mmol/L (ref 3.5–5.1)
SODIUM: 139 mmol/L (ref 135–145)
Total Bilirubin: 1.6 mg/dL — ABNORMAL HIGH (ref 0.3–1.2)
Total Protein: 6.9 g/dL (ref 6.5–8.1)

## 2016-11-27 LAB — CREATININE, SERUM
CREATININE: 0.84 mg/dL (ref 0.61–1.24)
GFR calc Af Amer: >60 mL/min (ref >60)

## 2016-11-27 SURGERY — REPAIR, HERNIA, INGUINAL, ADULT
Anesthesia: General | Site: Inguinal | Laterality: Right

## 2016-11-27 MED ORDER — FENTANYL CITRATE (PF) 250 MCG/5ML IJ SOLN
INTRAMUSCULAR | Status: AC
  Filled 2016-11-27: qty 5

## 2016-11-27 MED ORDER — METHOCARBAMOL 500 MG PO TABS: 500.0000 mg | ORAL_TABLET | Freq: Four times a day (QID) | ORAL | Status: DC | PRN

## 2016-11-27 MED ORDER — POLYETHYLENE GLYCOL 3350 17 G PO PACK
17.0000 g | PACK | Freq: Every day | ORAL | Status: DC | PRN
  Administered 2016-11-30 – 2016-12-02 (×2): 17 g via ORAL
  Filled 2016-11-27 (×2): qty 1

## 2016-11-27 MED ORDER — BUPIVACAINE-EPINEPHRINE 0.25% -1:200000 IJ SOLN
INTRAMUSCULAR | Status: DC | PRN
  Administered 2016-11-27: 8 mL

## 2016-11-27 MED ORDER — SUGAMMADEX SODIUM 200 MG/2ML IV SOLN
INTRAVENOUS | Status: DC | PRN
  Administered 2016-11-27: 200 mg via INTRAVENOUS

## 2016-11-27 MED ORDER — PANTOPRAZOLE SODIUM 40 MG IV SOLR
40.0000 mg | Freq: Every day | INTRAVENOUS | Status: DC
  Administered 2016-11-27 – 2016-11-29 (×3): 40 mg via INTRAVENOUS
  Filled 2016-11-27 (×3): qty 40

## 2016-11-27 MED ORDER — ONDANSETRON HCL 4 MG/2ML IJ SOLN: 4.0000 mg | Freq: Four times a day (QID) | INTRAMUSCULAR | Status: DC | PRN

## 2016-11-27 MED ORDER — LACTATED RINGERS IV SOLN
INTRAVENOUS | Status: DC | PRN
  Administered 2016-11-27 (×2): via INTRAVENOUS

## 2016-11-27 MED ORDER — CEFAZOLIN SODIUM-DEXTROSE 2-4 GM/100ML-% IV SOLN
INTRAVENOUS | Status: AC
  Filled 2016-11-27: qty 100

## 2016-11-27 MED ORDER — HYDROMORPHONE 1 MG/ML IV SOLN
INTRAVENOUS | Status: DC
  Administered 2016-11-27: 20:00:00 via INTRAVENOUS
  Administered 2016-11-28: 0.3 mg via INTRAVENOUS
  Administered 2016-11-28 (×2): 0 mg via INTRAVENOUS
  Administered 2016-11-28 (×4): 0.6 mg via INTRAVENOUS
  Administered 2016-11-29: 0 mg via INTRAVENOUS
  Administered 2016-11-29 (×2): 0.6 mg via INTRAVENOUS
  Administered 2016-11-29: 0.3 mg via INTRAVENOUS
  Administered 2016-11-29: 0.6 mg via INTRAVENOUS
  Administered 2016-11-30 (×2): 0.3 mg via INTRAVENOUS

## 2016-11-27 MED ORDER — HYDRALAZINE HCL 20 MG/ML IJ SOLN: 10.0000 mg | INTRAMUSCULAR | Status: DC | PRN

## 2016-11-27 MED ORDER — SUCCINYLCHOLINE CHLORIDE 20 MG/ML IJ SOLN
INTRAMUSCULAR | Status: DC | PRN
  Administered 2016-11-27: 100 mg via INTRAVENOUS

## 2016-11-27 MED ORDER — HEPARIN SODIUM (PORCINE) 5000 UNIT/ML IJ SOLN
5000.0000 [IU] | Freq: Three times a day (TID) | INTRAMUSCULAR | Status: DC
  Administered 2016-11-28 – 2016-12-02 (×13): 5000 [IU] via SUBCUTANEOUS
  Filled 2016-11-27 (×13): qty 1

## 2016-11-27 MED ORDER — ONDANSETRON HCL 4 MG/2ML IJ SOLN
INTRAMUSCULAR | Status: DC | PRN
  Administered 2016-11-27: 4 mg via INTRAVENOUS

## 2016-11-27 MED ORDER — VANCOMYCIN HCL IN DEXTROSE 1-5 GM/200ML-% IV SOLN
INTRAVENOUS | Status: AC
  Filled 2016-11-27: qty 200

## 2016-11-27 MED ORDER — PROPOFOL 10 MG/ML IV BOLUS
INTRAVENOUS | Status: DC | PRN
  Administered 2016-11-27: 180 mg via INTRAVENOUS

## 2016-11-27 MED ORDER — KCL IN DEXTROSE-NACL 20-5-0.45 MEQ/L-%-% IV SOLN
INTRAVENOUS | Status: DC
  Administered 2016-11-27 – 2016-11-30 (×8): via INTRAVENOUS
  Filled 2016-11-27 (×9): qty 1000

## 2016-11-27 MED ORDER — ONDANSETRON HCL 4 MG/2ML IJ SOLN
4.0000 mg | Freq: Four times a day (QID) | INTRAMUSCULAR | Status: DC | PRN
  Administered 2016-11-28 (×2): 4 mg via INTRAVENOUS
  Filled 2016-11-27 (×4): qty 2

## 2016-11-27 MED ORDER — LIDOCAINE HCL (CARDIAC) 20 MG/ML IV SOLN
INTRAVENOUS | Status: DC | PRN
  Administered 2016-11-27: 80 mg via INTRAVENOUS

## 2016-11-27 MED ORDER — METHYLENE BLUE 0.5 % INJ SOLN
INTRAVENOUS | Status: AC
  Filled 2016-11-27: qty 10

## 2016-11-27 MED ORDER — HYDROMORPHONE 1 MG/ML IV SOLN
INTRAVENOUS | Status: AC
  Filled 2016-11-27: qty 25

## 2016-11-27 MED ORDER — NALOXONE HCL 0.4 MG/ML IJ SOLN: 0.4000 mg | INTRAMUSCULAR | Status: DC | PRN

## 2016-11-27 MED ORDER — FENTANYL CITRATE (PF) 100 MCG/2ML IJ SOLN
INTRAMUSCULAR | Status: AC
  Administered 2016-11-27: 50 ug via INTRAVENOUS
  Filled 2016-11-27: qty 2

## 2016-11-27 MED ORDER — DIPHENHYDRAMINE HCL 12.5 MG/5ML PO ELIX: 12.5000 mg | ORAL_SOLUTION | Freq: Four times a day (QID) | ORAL | Status: DC | PRN

## 2016-11-27 MED ORDER — CEFAZOLIN SODIUM-DEXTROSE 2-4 GM/100ML-% IV SOLN
2.0000 g | Freq: Three times a day (TID) | INTRAVENOUS | Status: DC
  Administered 2016-11-27: 2 g via INTRAVENOUS

## 2016-11-27 MED ORDER — ACETAMINOPHEN 500 MG PO TABS
1000.0000 mg | ORAL_TABLET | Freq: Three times a day (TID) | ORAL | Status: DC
  Administered 2016-11-27 – 2016-11-30 (×8): 1000 mg via ORAL
  Filled 2016-11-27 (×8): qty 2

## 2016-11-27 MED ORDER — HYDROMORPHONE HCL 1 MG/ML IJ SOLN
1.0000 mg | Freq: Once | INTRAMUSCULAR | Status: DC
  Filled 2016-11-27: qty 1

## 2016-11-27 MED ORDER — DIAZEPAM 5 MG/ML IJ SOLN: 5.0000 mg | Freq: Once | INTRAMUSCULAR | Status: DC

## 2016-11-27 MED ORDER — DEXAMETHASONE SODIUM PHOSPHATE 4 MG/ML IJ SOLN
INTRAMUSCULAR | Status: DC | PRN
  Administered 2016-11-27: 10 mg via INTRAVENOUS

## 2016-11-27 MED ORDER — MIDAZOLAM HCL 2 MG/2ML IJ SOLN
INTRAMUSCULAR | Status: AC
  Filled 2016-11-27: qty 2

## 2016-11-27 MED ORDER — FENTANYL CITRATE (PF) 100 MCG/2ML IJ SOLN
INTRAMUSCULAR | Status: DC | PRN
  Administered 2016-11-27 (×6): 50 ug via INTRAVENOUS
  Administered 2016-11-27: 100 ug via INTRAVENOUS

## 2016-11-27 MED ORDER — OXYCODONE HCL 5 MG PO TABS
ORAL_TABLET | ORAL | Status: AC
  Administered 2016-11-27: 10 mg via ORAL
  Filled 2016-11-27: qty 2

## 2016-11-27 MED ORDER — DIPHENHYDRAMINE HCL 50 MG/ML IJ SOLN: 12.5000 mg | Freq: Four times a day (QID) | INTRAMUSCULAR | Status: DC | PRN

## 2016-11-27 MED ORDER — 0.9 % SODIUM CHLORIDE (POUR BTL) OPTIME
TOPICAL | Status: DC | PRN
  Administered 2016-11-27 (×3): 1000 mL

## 2016-11-27 MED ORDER — PHENYLEPHRINE HCL 10 MG/ML IJ SOLN
INTRAMUSCULAR | Status: DC | PRN
  Administered 2016-11-27: 120 ug via INTRAVENOUS
  Administered 2016-11-27: 80 ug via INTRAVENOUS
  Administered 2016-11-27: 120 ug via INTRAVENOUS

## 2016-11-27 MED ORDER — BUPIVACAINE-EPINEPHRINE (PF) 0.25% -1:200000 IJ SOLN
INTRAMUSCULAR | Status: AC
  Filled 2016-11-27: qty 30

## 2016-11-27 MED ORDER — ROCURONIUM BROMIDE 100 MG/10ML IV SOLN
INTRAVENOUS | Status: DC | PRN
  Administered 2016-11-27: 30 mg via INTRAVENOUS
  Administered 2016-11-27: 20 mg via INTRAVENOUS
  Administered 2016-11-27 (×2): 10 mg via INTRAVENOUS

## 2016-11-27 MED ORDER — PROPOFOL 10 MG/ML IV BOLUS
INTRAVENOUS | Status: AC
  Filled 2016-11-27: qty 20

## 2016-11-27 MED ORDER — SODIUM CHLORIDE 0.9% FLUSH: 9.0000 mL | INTRAVENOUS | Status: DC | PRN

## 2016-11-27 MED ORDER — FENTANYL CITRATE (PF) 100 MCG/2ML IJ SOLN
25.0000 ug | INTRAMUSCULAR | Status: DC | PRN
  Administered 2016-11-27 (×3): 50 ug via INTRAVENOUS

## 2016-11-27 MED ORDER — ONDANSETRON 4 MG PO TBDP: 4.0000 mg | ORAL_TABLET | Freq: Four times a day (QID) | ORAL | Status: DC | PRN

## 2016-11-27 MED ORDER — ONDANSETRON HCL 4 MG/2ML IJ SOLN
4.0000 mg | Freq: Once | INTRAMUSCULAR | Status: DC
  Filled 2016-11-27: qty 2

## 2016-11-27 MED ORDER — LACTATED RINGERS IV SOLN
INTRAVENOUS | Status: DC
  Administered 2016-11-27: 13:00:00 via INTRAVENOUS

## 2016-11-27 MED ORDER — DOCUSATE SODIUM 100 MG PO CAPS
100.0000 mg | ORAL_CAPSULE | Freq: Two times a day (BID) | ORAL | Status: DC
  Administered 2016-11-27 – 2016-12-02 (×10): 100 mg via ORAL
  Filled 2016-11-27 (×10): qty 1

## 2016-11-27 MED ORDER — OXYCODONE HCL 5 MG PO TABS
5.0000 mg | ORAL_TABLET | ORAL | Status: DC | PRN
  Administered 2016-11-27: 10 mg via ORAL

## 2016-11-27 MED ORDER — EPHEDRINE SULFATE 50 MG/ML IJ SOLN
INTRAMUSCULAR | Status: DC | PRN
  Administered 2016-11-27: 10 mg via INTRAVENOUS
  Administered 2016-11-27: 5 mg via INTRAVENOUS

## 2016-11-27 SURGICAL SUPPLY — 68 items
BLADE CLIPPER SURG (BLADE) ×5 IMPLANT
BLADE SURG 10 STRL SS (BLADE) ×5 IMPLANT
BLADE SURG 15 STRL LF DISP TIS (BLADE) ×3 IMPLANT
BLADE SURG 15 STRL SS (BLADE) ×2
CANISTER SUCT 3000ML PPV (MISCELLANEOUS) ×5 IMPLANT
CHLORAPREP W/TINT 26ML (MISCELLANEOUS) ×5 IMPLANT
COVER SURGICAL LIGHT HANDLE (MISCELLANEOUS) ×5 IMPLANT
DERMABOND ADVANCED (GAUZE/BANDAGES/DRESSINGS) ×2
DERMABOND ADVANCED .7 DNX12 (GAUZE/BANDAGES/DRESSINGS) ×3 IMPLANT
DRAIN PENROSE 1/2X12 LTX STRL (WOUND CARE) ×5 IMPLANT
DRAPE LAPAROTOMY TRNSV 102X78 (DRAPE) ×5 IMPLANT
DRAPE UTILITY XL STRL (DRAPES) ×5 IMPLANT
DRAPE WARM FLUID 44X44 (DRAPE) ×5 IMPLANT
ELECT CAUTERY BLADE 6.4 (BLADE) ×5 IMPLANT
ELECT REM PT RETURN 9FT ADLT (ELECTROSURGICAL) ×5
ELECTRODE REM PT RTRN 9FT ADLT (ELECTROSURGICAL) ×3 IMPLANT
FILTER STRAW FLUID ASPIR (MISCELLANEOUS) ×5 IMPLANT
GAUZE SPONGE 4X4 12PLY STRL LF (GAUZE/BANDAGES/DRESSINGS) ×5 IMPLANT
GLOVE BIO SURGEON STRL SZ7.5 (GLOVE) ×5 IMPLANT
GLOVE BIOGEL PI IND STRL 8 (GLOVE) ×3 IMPLANT
GLOVE BIOGEL PI INDICATOR 8 (GLOVE) ×2
GLOVE SURG SIGNA 7.5 PF LTX (GLOVE) ×5 IMPLANT
GLOVE SURG SS PI 6.5 STRL IVOR (GLOVE) ×5 IMPLANT
GOWN STRL REUS W/ TWL LRG LVL3 (GOWN DISPOSABLE) ×9 IMPLANT
GOWN STRL REUS W/ TWL XL LVL3 (GOWN DISPOSABLE) ×6 IMPLANT
GOWN STRL REUS W/TWL LRG LVL3 (GOWN DISPOSABLE) ×6
GOWN STRL REUS W/TWL XL LVL3 (GOWN DISPOSABLE) ×4
KIT BASIN OR (CUSTOM PROCEDURE TRAY) ×5 IMPLANT
KIT ROOM TURNOVER OR (KITS) ×5 IMPLANT
MESH ULTRAPRO 3X6 7.6X15CM (Mesh General) ×5 IMPLANT
NEEDLE HYPO 25GX1X1/2 BEV (NEEDLE) ×5 IMPLANT
NS IRRIG 1000ML POUR BTL (IV SOLUTION) ×5 IMPLANT
PACK SURGICAL SETUP 50X90 (CUSTOM PROCEDURE TRAY) ×5 IMPLANT
PAD ARMBOARD 7.5X6 YLW CONV (MISCELLANEOUS) ×5 IMPLANT
PENCIL BUTTON HOLSTER BLD 10FT (ELECTRODE) ×5 IMPLANT
SOLUTION ANTI FOG 6CC (MISCELLANEOUS) ×5 IMPLANT
SPECIMEN JAR SMALL (MISCELLANEOUS) IMPLANT
SPONGE LAP 18X18 X RAY DECT (DISPOSABLE) ×15 IMPLANT
STAPLER VISISTAT 35W (STAPLE) ×5 IMPLANT
SUT ETHIBOND 0 MO6 C/R (SUTURE) ×10 IMPLANT
SUT MNCRL AB 4-0 PS2 18 (SUTURE) ×5 IMPLANT
SUT PDS AB 1 CTX 36 (SUTURE) ×10 IMPLANT
SUT SILK 0 FSL (SUTURE) ×5 IMPLANT
SUT SILK 2 0 (SUTURE) ×2
SUT SILK 2 0 SH CR/8 (SUTURE) ×5 IMPLANT
SUT SILK 2-0 18XBRD TIE 12 (SUTURE) ×3 IMPLANT
SUT SILK 3 0 (SUTURE) ×2
SUT SILK 3 0 SH CR/8 (SUTURE) ×5 IMPLANT
SUT SILK 3-0 18XBRD TIE 12 (SUTURE) ×3 IMPLANT
SUT VIC AB 0 CT1 27 (SUTURE) ×2
SUT VIC AB 0 CT1 27XBRD ANBCTR (SUTURE) ×3 IMPLANT
SUT VIC AB 2-0 SH 27 (SUTURE) ×2
SUT VIC AB 2-0 SH 27X BRD (SUTURE) ×3 IMPLANT
SUT VIC AB 3-0 SH 27 (SUTURE) ×2
SUT VIC AB 3-0 SH 27XBRD (SUTURE) ×3 IMPLANT
SUT VICRYL 0 UR6 27IN ABS (SUTURE) ×5 IMPLANT
SYR BULB 3OZ (MISCELLANEOUS) ×5 IMPLANT
SYR CONTROL 10ML LL (SYRINGE) ×10 IMPLANT
SYR TOOMEY 50ML (SYRINGE) ×5 IMPLANT
TAPE CLOTH SURG 4X10 WHT LF (GAUZE/BANDAGES/DRESSINGS) ×5 IMPLANT
TOWEL OR 17X24 6PK STRL BLUE (TOWEL DISPOSABLE) ×5 IMPLANT
TOWEL OR 17X26 10 PK STRL BLUE (TOWEL DISPOSABLE) ×5 IMPLANT
TRAY FOLEY CATH SILVER 16FR (SET/KITS/TRAYS/PACK) ×5 IMPLANT
TROCAR XCEL BLUNT TIP 100MML (ENDOMECHANICALS) ×5 IMPLANT
TUBE CONNECTING 12'X1/4 (SUCTIONS) ×1
TUBE CONNECTING 12X1/4 (SUCTIONS) ×4 IMPLANT
TUBING INSUFFLATION (TUBING) ×5 IMPLANT
YANKAUER SUCT BULB TIP NO VENT (SUCTIONS) ×5 IMPLANT

## 2016-11-27 NOTE — H&P (Signed)
[facility] Admission Note  Keith Romero June 01, 1965  [id number].    Requesting MD: Ashok Cordia Chief Complaint/Reason for Consult: Right inguinal hernia HPI:  Patient is a 51 y/o male with known right inguinal hernia. S/p reduction in the ED 2 weeks ago, was doing fine after this until yesterday. Felt like he was getting constipated, then was lifting a chair at work (15-20 lbs) and felt hernia pop out. Tried to reduce at home without success. Pain and nausea has progressively worsened. Denies vomiting, fever, chills, urinary symptoms. No significant PMH, not on any blood thinning medications. No past abdominal surgeries. Smokes 1-2 cigars per week, no illicit drug use, drinks alcohol occasionally. Works as a Building control surveyor.   ROS: Review of Systems  Constitutional: Negative for chills and fever.  Respiratory: Negative for shortness of breath and wheezing.   Cardiovascular: Negative for chest pain and palpitations.  Gastrointestinal: Positive for abdominal pain, constipation and nausea. Negative for blood in stool, diarrhea and vomiting.  Genitourinary: Negative for dysuria, frequency and urgency.  Neurological: Positive for weakness.  All other systems reviewed and are negative.   No family history on file.  No past medical history on file.  No past surgical history on file.  Social History:  reports that he has never smoked. He has never used smokeless tobacco. He reports that he drinks alcohol. He reports that he does not use drugs.  Allergies:  Allergies  Allergen Reactions  . Penicillins Hives     (Not in a hospital admission)  Blood pressure (!) 143/79, pulse 69, temperature 98.3 F (36.8 C), temperature source Oral, resp. rate 17, SpO2 99 %. Physical Exam: Physical Exam  Constitutional: He is oriented to person, place, and time. He appears cachectic. He is cooperative.  Non-toxic appearance. No distress.  HENT:  Head: Normocephalic and atraumatic.  Right Ear:  External ear normal.  Left Ear: External ear normal.  Nose: Nose normal.  Mouth/Throat: Oropharynx is clear and moist and mucous membranes are normal. Abnormal dentition.  Eyes: Pupils are equal, round, and reactive to light. Conjunctivae and lids are normal. No scleral icterus.  Neck: Normal range of motion and phonation normal. Neck supple. No thyromegaly present.  Cardiovascular: Normal rate and regular rhythm.   Pulses:      Radial pulses are 2+ on the right side, and 2+ on the left side.       Dorsalis pedis pulses are 2+ on the right side, and 2+ on the left side.  Pulmonary/Chest: Effort normal and breath sounds normal.  Abdominal: Soft. Normal appearance. He exhibits no distension. Bowel sounds are decreased. There is no hepatosplenomegaly. There is tenderness in the right lower quadrant and suprapubic area. There is no rigidity, no rebound and no guarding. A hernia is present. Hernia confirmed positive in the right inguinal area (incarcerated but reducible).  Genitourinary: Penis normal. Right testis shows swelling. Left testis shows no swelling.  Musculoskeletal:  ROM intact in BL upper and lower extremities.   Neurological: He is alert and oriented to person, place, and time. He has normal strength. No sensory deficit.  Skin: Skin is warm, dry and intact. No rash noted. He is not diaphoretic.  Psychiatric: He has a normal mood and affect. His speech is normal and behavior is normal.    Results for orders placed or performed during the hospital encounter of 11/27/16 (from the past 48 hour(s))  Comprehensive metabolic panel     Status: Abnormal   Collection Time: 11/27/16 12:26 AM  Result Value Ref Range   Sodium 139 135 - 145 mmol/L   Potassium 4.1 3.5 - 5.1 mmol/L   Chloride 103 101 - 111 mmol/L   CO2 27 22 - 32 mmol/L   Glucose, Bld 109 (H) 65 - 99 mg/dL   BUN 8 6 - 20 mg/dL   Creatinine, Ser 0.71 0.61 - 1.24 mg/dL   Calcium 9.3 8.9 - 10.3 mg/dL   Total Protein 6.9 6.5 -  8.1 g/dL   Albumin 4.3 3.5 - 5.0 g/dL   AST 25 15 - 41 U/L   ALT 15 (L) 17 - 63 U/L   Alkaline Phosphatase 65 38 - 126 U/L   Total Bilirubin 1.6 (H) 0.3 - 1.2 mg/dL   GFR calc non Af Amer >60 >60 mL/min   GFR calc Af Amer >60 >60 mL/min    Comment: (NOTE) The eGFR has been calculated using the CKD EPI equation. This calculation has not been validated in all clinical situations. eGFR's persistently <60 mL/min signify possible Chronic Kidney Disease.    Anion gap 9 5 - 15  CBC     Status: Abnormal   Collection Time: 11/27/16 12:26 AM  Result Value Ref Range   WBC 7.4 4.0 - 10.5 K/uL   RBC 4.07 (L) 4.22 - 5.81 MIL/uL   Hemoglobin 13.4 13.0 - 17.0 g/dL   HCT 40.7 39.0 - 52.0 %   MCV 100.0 78.0 - 100.0 fL   MCH 32.9 26.0 - 34.0 pg   MCHC 32.9 30.0 - 36.0 g/dL   RDW 12.5 11.5 - 15.5 %   Platelets 200 150 - 400 K/uL  Urinalysis, Routine w reflex microscopic     Status: None   Collection Time: 11/27/16 12:30 AM  Result Value Ref Range   Color, Urine YELLOW YELLOW   APPearance CLEAR CLEAR   Specific Gravity, Urine 1.020 1.005 - 1.030   pH 7.0 5.0 - 8.0   Glucose, UA NEGATIVE NEGATIVE mg/dL   Hgb urine dipstick NEGATIVE NEGATIVE   Bilirubin Urine NEGATIVE NEGATIVE   Ketones, ur NEGATIVE NEGATIVE mg/dL   Protein, ur NEGATIVE NEGATIVE mg/dL   Nitrite NEGATIVE NEGATIVE   Leukocytes, UA NEGATIVE NEGATIVE   No results found.    Assessment/Plan Right inguinal hernia, reducible - Right inguinal hernia reduced in ED by Dr. Dema Severin - afebrile, no signs of peritonitis - given that this has happened 2x in the last few weeks, and patient was supposed to f/u with [facility] to have repaired - will proceed with open inguinal hernia repair today - NPO, IVF, abx on call to OR, consent patient  Admit to observation post-op. May be able to discharge tomorrow if pain controlled and tolerating diet.   Brigid Re, [facility] 11/27/2016, 9:37 AM Pager: [phone] Consults:  [phone] Mon-Fri 7:00 am-4:30 pm Sat-Sun 7:00 am-11:30 am

## 2016-11-27 NOTE — Anesthesia Procedure Notes (Signed)
Procedure Name: Intubation Date/Time: 11/27/2016 1:49 PM Performed by: Oletta Lamas Pre-anesthesia Checklist: Patient identified, Emergency Drugs available, Suction available and Patient being monitored Patient Re-evaluated:Patient Re-evaluated prior to induction Oxygen Delivery Method: Circle System Utilized Preoxygenation: Pre-oxygenation with 100% oxygen Induction Type: IV induction Ventilation: Mask ventilation without difficulty Laryngoscope Size: Mac and 4 Tube type: Oral Tube size: 7.5 mm Number of attempts: 1 Airway Equipment and Method: Stylet and Oral airway Placement Confirmation: ETT inserted through vocal cords under direct vision,  positive ETCO2 and breath sounds checked- equal and bilateral Secured at: 23 cm Tube secured with: Tape Dental Injury: Teeth and Oropharynx as per pre-operative assessment

## 2016-11-27 NOTE — Transfer of Care (Signed)
Immediate Anesthesia Transfer of Care Note  Patient: Nyair Durden  Procedure(s) Performed: RIGHT INGUINAL HERNIA REPAIR (Right Groin) LAPAROSCOPY DIAGNOSTIC (N/A Abdomen) INSERTION OF MESH (Right Inguinal) EXPLORATORY LAPAROTOMY (N/A Abdomen)  Patient Location: PACU  Anesthesia Type:General  Level of Consciousness: drowsy and patient cooperative  Airway & Oxygen Therapy: Patient Spontanous Breathing and Patient connected to nasal cannula oxygen  Post-op Assessment: Report given to RN and Post -op Vital signs reviewed and stable  Post vital signs: Reviewed and stable  Last Vitals:  Vitals:   11/27/16 1033 11/27/16 1710  BP: 138/81 (P) 125/79  Pulse: (!) 50 (P) 75  Resp:  (P) 12  Temp:  (P) 36.9 C  SpO2: 100% (P) 100%    Last Pain:  Vitals:   11/27/16 0831  TempSrc:   PainSc: 6          Complications: No apparent anesthesia complications

## 2016-11-27 NOTE — Anesthesia Postprocedure Evaluation (Signed)
Anesthesia Post Note  Patient: Keith Romero  Procedure(s) Performed: RIGHT INGUINAL HERNIA REPAIR (Right Groin) LAPAROSCOPY DIAGNOSTIC (N/A Abdomen) INSERTION OF MESH (Right Inguinal) EXPLORATORY LAPAROTOMY (N/A Abdomen)     Patient location during evaluation: PACU Anesthesia Type: General Level of consciousness: awake Pain management: pain level controlled Vital Signs Assessment: post-procedure vital signs reviewed and stable Respiratory status: spontaneous breathing Cardiovascular status: stable Anesthetic complications: no    Last Vitals:  Vitals:   11/27/16 1033 11/27/16 1710  BP: 138/81 125/79  Pulse: (!) 50 75  Resp:  12  Temp:  36.9 C  SpO2: 100% 100%    Last Pain:  Vitals:   11/27/16 1710  TempSrc:   PainSc: 0-No pain                 Keith Romero

## 2016-11-27 NOTE — Op Note (Addendum)
Hernia, Open, Procedure Note  Indications: The patient presented with a history of a right, inguinal hernia - he had a history of multiple incarcerations all reduced in the ER with some difficulty and even moderate sedation. Today, he presented with the same. Difficult to reduce and a fullness in his right groin remained. Given all of this and his numerous episodes and difficulty arranging follow-up, an inguinal hernia repair was discussed and offered. Please refer to the consult note for details regarding the consent process.  Pre-operative Diagnosis: right inguinal hernia Post-operative Diagnosis: same  Surgeon: Andria Meusehristopher M Gaylene Moylan MD  Assistants: Abigail Miyamotoouglas Blackman, MD  Anesthesia: General endotracheal anesthesia  Specimen: Hernia sac  Findings: Right inguinal hernia - the hernia sac was found and was very thick and edematous - wall of the sac was at least 1cm thick and not reducible into the peritoneal cavity due to how edematous it was. To confirm appropriate anatomy, the bladder was filled with dilute methylene blue/saline. This sac did not distend. Given it's thickness and edematous appearance, I was concerned he could have incarcerated cecum. To confirm this was not the case, a diagnostic laparoscopy was performed. A diagnostic laparoscopy was performed and demonstrated contused mesentery and hyperemic bowel as well as numerous small bowel to small bowel adhesions - consistent with his numerous prior significant incarcerations and reductions. Given the question of bowel appearance and mesentery viability, a low midline laparotomy was made. At this point, the bowel appeared pink and hyperemic but clearly viable. The mesentery was also viable.  Procedure Details  The patient was seen again in the Holding Room. The risks, benefits, complications, treatment options, and expected outcomes were discussed with the patient. The possibilities of reaction to medication, pulmonary aspiration,  perforation of viscus, bleeding, recurrent infection, the need for additional procedures, and development of a complication requiring transfusion or further operation were discussed with the patient and/or family. The likelihood of success in repairing the hernia and returning the patient to their previous functional status is good.  There was concurrence with the proposed plan, and informed consent was obtained. The site of surgery was properly noted/marked. The patient was taken to the Operating Room, [facility] and the procedure verified as right inguinal hernia repair. A Time Out was held and the above information confirmed.  The patient was placed in the supine position and underwent induction of anesthesia. The lower abdomen and groin was prepped with Chloraprep and draped in the standard fashion, and 0.25% Marcaine with epinephrine was used to anesthetize the skin over the mid-portion of the inguinal canal. An oblique incision was made. Dissection was carried down through the subcutaneous tissue with cautery to the external oblique fascia.  We opened the external oblique fascia along the direction of its fibers to the external ring.  The spermatic cord was circumferentially dissected bluntly and retracted with a Penrose drain.  The ilioinguinal nerve was identified and divided given its location across the inguinal floor and the potential mesh involvement.  The floor of the inguinal canal was inspected. The hernia sac was readily identified and very edematous. The spermatic cord was skeletonized. The sac was not reducible and given its thickness, the decision was made to fill the bladder with dilue methylene blue. The sac did not distend. To confirm this was not incarcerated colon/cecum, a diagnostic laparoscopy was performed. An infraumbilical incision was made, the umbilical stalk grasped and retracted outwardly and the infraumbilical fascia incised. The peritoneal cavity entered  bluntly. A purse  string of 0 Vicryl was placed. A 10mm Hasson cannula placed and the abdomen insufflated to with CO2. A laparoscope was inserted. Inspection as noted above. Given the question of mesentery and bowel appearance on laparoscopy, a midline laparotomy was planned to better delineate and visualize the segment of bowel. This loop of small bowel was hyperemic but clearly viable. The mesentery associated had a small contusion but was viable. The midline laparotomy was closed with two running #1 PDS suture. The skin was approximated with staples. We turned our attention to the inguinal floor again. Given the thickness and edematous nature of this, we opted to excise the sac - it was not reducible and performed high ligation with a purse string of 0 Vicryl suture. The hernia sac was passed off as a specimen. Hemostasis verified. No contamination had occurred throughout the case so a mesh repair was planned.  We used an Ethicon Ultrapro mesh which was cut to fit his inguinal floor and deployed across the floor of the inguinal canal. The mesh was secured medially to the pubic tubercle with 0 Ethibond suture - 2 sutures, with at least 2cm of overlap on the tubercle. Inferiorly, secured to the shelving edge of the inguinal ligament with 0 Ethibond and superiorly to the conjoined tendon using 0 Ethibond. The mesh was tucked underneath the external oblique fascia laterally.  The flap of the mesh was closed around the spermatic cord to recreate the internal inguinal ring and accommodate the tip of a finger. The tails were secured to each other using 0 Ethibond suture.  The external oblique fascia was reapproximated with 2-0 Vicryl.  3-0 Vicryl was used to close the Scarpa's. The skin was approximated with staples. A clean sterile dressing was applied.  The patient was then extubated and brought to the recovery room in stable condition.  All sponge, instrument, and needle counts were correct prior to closure  and at the conclusion of the case.   Estimated Blood Loss: less than 50 mL                 Complications: None; patient tolerated the procedure well.         Disposition: PACU - hemodynamically stable.         Condition: stable

## 2016-11-27 NOTE — Anesthesia Preprocedure Evaluation (Addendum)
Anesthesia Evaluation  Patient identified by MRN, date of birth, ID band Patient awake    Reviewed: Allergy & Precautions, NPO status , Patient's Chart, lab work & pertinent test results  Airway Mallampati: II  TM Distance: >3 FB Neck ROM: Full    Dental  (+) Edentulous Upper   Pulmonary neg pulmonary ROS, Current Smoker,    Pulmonary exam normal breath sounds clear to auscultation       Cardiovascular negative cardio ROS Normal cardiovascular exam Rhythm:Regular Rate:Normal     Neuro/Psych negative neurological ROS  negative psych ROS   GI/Hepatic negative GI ROS, Neg liver ROS,   Endo/Other  negative endocrine ROS  Renal/GU negative Renal ROS  negative genitourinary   Musculoskeletal negative musculoskeletal ROS (+)   Abdominal   Peds negative pediatric ROS (+)  Hematology negative hematology ROS (+)   Anesthesia Other Findings Inguinal hernia  Reproductive/Obstetrics negative OB ROS                            Anesthesia Physical Anesthesia Plan  ASA: II  Anesthesia Plan: General   Post-op Pain Management:    Induction: Intravenous  PONV Risk Score and Plan: 2 and Ondansetron and Midazolam  Airway Management Planned: Oral ETT  Additional Equipment:   Intra-op Plan:   Post-operative Plan: Extubation in OR  Informed Consent: I have reviewed the patients History and Physical, chart, labs and discussed the procedure including the risks, benefits and alternatives for the proposed anesthesia with the patient or authorized representative who has indicated his/her understanding and acceptance.   Dental advisory given  Plan Discussed with: CRNA  Anesthesia Plan Comments:         Anesthesia Quick Evaluation

## 2016-11-27 NOTE — ED Triage Notes (Signed)
Pt presents with RLQ abd pain related to known hernia; denies n/v/d; pt reports some heavy lifting today, attempted to reduce it at home as PCP instructed but could not

## 2016-11-27 NOTE — ED Provider Notes (Signed)
[facility] EMERGENCY DEPARTMENT Provider Note   CSN: [id number] Arrival date & time: 11/27/16  0016     History   Chief Complaint Chief Complaint  Patient presents with  . Hernia    HPI Keith Romero is a 51 y.o. male.  Patient c/o swelling, and pain to right inguinal region for past day. Symptoms moderate, persistent, constant, without specific exacerbating or alleviating factors.  States hx right inguinal hernia for past year, and that it comes in and out occasionally, but normally is reducible - cannot reduce in past day. Nausea. No vomiting. No fever or chills.    The history is provided by the patient.    No past medical history on file.  There are no active problems to display for this patient.   No past surgical history on file.     Home Medications    Prior to Admission medications   Medication Sig Start Date End Date Taking? Authorizing Provider  ciprofloxacin (CIPRO) 500 MG tablet Take 1 tablet (500 mg total) by mouth 2 (two) times daily. 09/08/15   Cartner, Sharlet Salina, PA-C  docusate sodium (COLACE) 100 MG capsule Take 1 capsule (100 mg total) by mouth every 12 (twelve) hours. Start with 1 tablet twice a day until stools are soft and then back off to one tablet daily 05/21/16   Earley Favor, NP  nitrofurantoin, macrocrystal-monohydrate, (MACROBID) 100 MG capsule Take 1 capsule (100 mg total) by mouth 2 (two) times daily. 09/25/14   Hedges, Tinnie Gens, PA-C  omeprazole (PRILOSEC) 20 MG capsule Take 1 capsule (20 mg total) by mouth daily. 09/25/14   Hedges, Tinnie Gens, PA-C  polyethylene glycol (MIRALAX / GLYCOLAX) packet Take 17 g by mouth daily. 11/04/16   Fayrene Helper, PA-C  promethazine (PHENERGAN) 25 MG tablet Take 1 tablet (25 mg total) by mouth every 6 (six) hours as needed for nausea or vomiting. 11/04/16   Fayrene Helper, PA-C    Family History No family history on file.  Social History Social History  Substance Use Topics  . Smoking status:  Never Smoker  . Smokeless tobacco: Never Used  . Alcohol use Yes     Allergies   Penicillins   Review of Systems Review of Systems  Constitutional: Negative for fever.  HENT: Negative for sore throat.   Eyes: Negative for redness.  Respiratory: Negative for shortness of breath.   Cardiovascular: Negative for chest pain.  Gastrointestinal: Positive for constipation and nausea. Negative for vomiting.  Endocrine: Negative for polyuria.  Genitourinary: Negative for flank pain.  Musculoskeletal: Negative for back pain.  Skin: Negative for rash.  Neurological: Negative for headaches.  Hematological: Does not bruise/bleed easily.  Psychiatric/Behavioral: Negative for confusion.     Physical Exam Updated Vital Signs BP (!) 143/79   Pulse 69   Temp 98.3 F (36.8 C) (Oral)   Resp 17   SpO2 99%   Physical Exam  Constitutional: He appears well-developed and well-nourished. No distress.  HENT:  Mouth/Throat: Oropharynx is clear and moist.  Eyes: Conjunctivae are normal.  Neck: Neck supple. No tracheal deviation present.  Cardiovascular: Normal rate, regular rhythm, normal heart sounds and intact distal pulses.   Pulmonary/Chest: Effort normal. No accessory muscle usage. No respiratory distress.  Abdominal: Soft. Bowel sounds are normal. He exhibits no distension and no mass. There is no rebound and no guarding. No hernia.  Mildly tender, non reducible right inguinal hernia, with bowel extending into right scrotum.   Genitourinary:  Genitourinary Comments: Right inguinal hernia,  otherwise normal external gu exam.   Musculoskeletal: He exhibits no edema.  Neurological: He is alert.  Skin: Skin is warm and dry. He is not diaphoretic.  Psychiatric: He has a normal mood and affect.  Nursing note and vitals reviewed.    ED Treatments / Results  Labs (all labs ordered are listed, but only abnormal results are displayed) Results for orders placed or performed during the  hospital encounter of 11/27/16  Comprehensive metabolic panel  Result Value Ref Range   Sodium 139 135 - 145 mmol/L   Potassium 4.1 3.5 - 5.1 mmol/L   Chloride 103 101 - 111 mmol/L   CO2 27 22 - 32 mmol/L   Glucose, Bld 109 (H) 65 - 99 mg/dL   BUN 8 6 - 20 mg/dL   Creatinine, Ser 1.610.71 0.61 - 1.24 mg/dL   Calcium 9.3 8.9 - 09.610.3 mg/dL   Total Protein 6.9 6.5 - 8.1 g/dL   Albumin 4.3 3.5 - 5.0 g/dL   AST 25 15 - 41 U/L   ALT 15 (L) 17 - 63 U/L   Alkaline Phosphatase 65 38 - 126 U/L   Total Bilirubin 1.6 (H) 0.3 - 1.2 mg/dL   GFR calc non Af Amer >60 >60 mL/min   GFR calc Af Amer >60 >60 mL/min   Anion gap 9 5 - 15  CBC  Result Value Ref Range   WBC 7.4 4.0 - 10.5 K/uL   RBC 4.07 (L) 4.22 - 5.81 MIL/uL   Hemoglobin 13.4 13.0 - 17.0 g/dL   HCT 04.540.7 40.939.0 - 81.152.0 %   MCV 100.0 78.0 - 100.0 fL   MCH 32.9 26.0 - 34.0 pg   MCHC 32.9 30.0 - 36.0 g/dL   RDW 91.412.5 78.211.5 - 95.615.5 %   Platelets 200 150 - 400 K/uL  Urinalysis, Routine w reflex microscopic  Result Value Ref Range   Color, Urine YELLOW YELLOW   APPearance CLEAR CLEAR   Specific Gravity, Urine 1.020 1.005 - 1.030   pH 7.0 5.0 - 8.0   Glucose, UA NEGATIVE NEGATIVE mg/dL   Hgb urine dipstick NEGATIVE NEGATIVE   Bilirubin Urine NEGATIVE NEGATIVE   Ketones, ur NEGATIVE NEGATIVE mg/dL   Protein, ur NEGATIVE NEGATIVE mg/dL   Nitrite NEGATIVE NEGATIVE   Leukocytes, UA NEGATIVE NEGATIVE   EKG  EKG Interpretation None       Radiology No results found.  Procedures Procedures (including critical care time)  Medications Ordered in ED Medications  HYDROmorphone (DILAUDID) injection 1 mg (not administered)  ondansetron (ZOFRAN) injection 4 mg (not administered)     Initial Impression / Assessment and Plan / ED Course  I have reviewed the triage vital signs and the nursing notes.  Pertinent labs & imaging results that were available during my care of the patient were reviewed by me and considered in my medical decision  making (see chart for details).  Labs sent from triage.  Once in room, initially unable to reduce hernia. Icepack, dilaudid 1 mg iv. zofran for nausea.   General surgery consulted re non reducible inguinal hernia.   Reviewed nursing notes and prior charts for additional history.   Disposition per general surgery team.   Final Clinical Impressions(s) / ED Diagnoses   Final diagnoses:  None    New Prescriptions New Prescriptions   No medications on file     Cathren LaineSteinl, Winford Hehn, MD 11/29/16 [phone]

## 2016-11-28 LAB — GLUCOSE, CAPILLARY: Glucose-Capillary: 144 mg/dL — ABNORMAL HIGH (ref 65–99)

## 2016-11-28 LAB — CBC
HEMATOCRIT: 32.5 % — AB (ref 39.0–52.0)
Hemoglobin: 10.4 g/dL — ABNORMAL LOW (ref 13.0–17.0)
MCH: 32.4 pg (ref 26.0–34.0)
MCHC: 32 g/dL (ref 30.0–36.0)
MCV: 101.2 fL — AB (ref 78.0–100.0)
Platelets: 207 10*3/uL (ref 150–400)
RBC: 3.21 MIL/uL — AB (ref 4.22–5.81)
RDW: 12.8 % (ref 11.5–15.5)
WBC: 13.7 10*3/uL — AB (ref 4.0–10.5)

## 2016-11-28 LAB — BASIC METABOLIC PANEL
Anion gap: 13 (ref 5–15)
BUN: 10 mg/dL (ref 6–20)
CHLORIDE: 99 mmol/L — AB (ref 101–111)
CO2: 22 mmol/L (ref 22–32)
Calcium: 8.2 mg/dL — ABNORMAL LOW (ref 8.9–10.3)
Creatinine, Ser: 0.94 mg/dL (ref 0.61–1.24)
GFR calc non Af Amer: >60 mL/min (ref >60)
Glucose, Bld: 163 mg/dL — ABNORMAL HIGH (ref 65–99)
POTASSIUM: 3.9 mmol/L (ref 3.5–5.1)
SODIUM: 134 mmol/L — AB (ref 135–145)

## 2016-11-28 LAB — HIV ANTIBODY (ROUTINE TESTING W REFLEX): HIV SCREEN 4TH GENERATION: NONREACTIVE

## 2016-11-28 NOTE — Progress Notes (Signed)
1 Day Post-Op  Subjective: Since sitting up in chair.  A little shaky since he had a recent episode of low volume emesis but otherwise doing well.  Denies respiratory problems.  Pain control good.  Afebrile.  Heart rate 60.  Normotensive.Good urine output. Voiding independently. No labs today.   Objective: Vital signs in last 24 hours: Temp:  [97.8 F (36.6 C)-98.7 F (37.1 C)] 98.7 F (37.1 C) (10/20 0500) Pulse Rate:  [57-90] 57 (10/20 0500) Resp:  [12-22] 19 (10/20 0836) BP: (100-142)/(60-91) 141/70 (10/20 0500) SpO2:  [98 %-100 %] 98 % (10/20 0836) FiO2 (%):  [98 %] 98 % (10/19 2357) Weight:  [74.6 kg (164 lb 6.4 oz)-77.1 kg (170 lb)] 74.6 kg (164 lb 6.4 oz) (10/19 1947) Last BM Date: 11/26/16  Intake/Output from previous day: 10/19 0701 - 10/20 0700 In: 3605.8 [P.O.:360; I.V.:3245.8] Out: 950 [Urine:850; Blood:100] Intake/Output this shift: No intake/output data recorded.  General appearance: alert and cooperative.  Mild distress and little shaky.  Skin dry. Resp: clear to auscultation bilaterally GI: soft and slightly distended. Occasional bowel sounds.  Wounds are clean.  Lab Results:  Results for orders placed or performed during the hospital encounter of 11/27/16 (from the past 24 hour(s))  HIV antibody (Routine Testing)     Status: None   Collection Time: 11/27/16  1:34 PM  Result Value Ref Range   HIV Screen 4th Generation wRfx Non Reactive Non Reactive  CBC     Status: Abnormal   Collection Time: 11/27/16  8:57 PM  Result Value Ref Range   WBC 15.1 (H) 4.0 - 10.5 K/uL   RBC 3.84 (L) 4.22 - 5.81 MIL/uL   Hemoglobin 12.5 (L) 13.0 - 17.0 g/dL   HCT 40.939.0 81.139.0 - 91.452.0 %   MCV 101.6 (H) 78.0 - 100.0 fL   MCH 32.6 26.0 - 34.0 pg   MCHC 32.1 30.0 - 36.0 g/dL   RDW 78.212.7 95.611.5 - 21.315.5 %   Platelets 212 150 - 400 K/uL  Creatinine, serum     Status: None   Collection Time: 11/27/16  8:57 PM  Result Value Ref Range   Creatinine, Ser 0.84 0.61 - 1.24 mg/dL   GFR calc  non Af Amer >60 >60 mL/min   GFR calc Af Amer >60 >60 mL/min  Glucose, capillary     Status: Abnormal   Collection Time: 11/28/16 10:36 AM  Result Value Ref Range   Glucose-Capillary 144 (H) 65 - 99 mg/dL     Studies/Results: No results found.  Marland Kitchen. acetaminophen  1,000 mg Oral Q8H  . docusate sodium  100 mg Oral BID  . heparin  5,000 Units Subcutaneous Q8H  . HYDROmorphone   Intravenous Q4H  . pantoprazole (PROTONIX) IV  40 mg Intravenous QHS     Assessment/Plan: s/p Procedure(s): RIGHT INGUINAL HERNIA REPAIR LAPAROSCOPY DIAGNOSTIC INSERTION OF MESH EXPLORATORY LAPAROTOMY  POD #1.  Laparotomy and repair inguinal hernia with mesh Stable Has ileus so nothing by mouth except sips Mobilize Incentive spirometry Subcutaneous heparin for DVT prophylaxis  @PROBHOSP @  LOS: 1 day    Keith Romero M 11/28/2016  . .prob

## 2016-11-28 NOTE — Progress Notes (Signed)
Pt refused to get stick with lab this am.

## 2016-11-29 NOTE — Progress Notes (Signed)
2 Days Post-Op  Subjective: Feeling better.  Nausea and vomiting have resolved.  No stool or flatus but tolerating clear liquids.  Does not want to advance diet yet.  Good urine output and voiding well.  Objective: Vital signs in last 24 hours: Temp:  [98.5 F (36.9 C)-98.7 F (37.1 C)] 98.5 F (36.9 C) (10/21 0531) Pulse Rate:  [71-104] 71 (10/21 0531) Resp:  [17-20] 18 (10/21 0800) BP: (136-146)/(59-95) 138/59 (10/21 0531) SpO2:  [96 %-100 %] 100 % (10/21 0800) Last BM Date: 11/26/16  Intake/Output from previous day: 10/20 0701 - 10/21 0700 In: 3193.3 [P.O.:660; I.V.:2533.3] Out: 2250 [Urine:2250] Intake/Output this shift: Total I/O In: -  Out: 400 [Urine:400]    General appearance: alert and cooperative. looks like he feels better today.   Skin dry. Resp: clear to auscultation bilaterally GI: soft Occasional bowel sounds.  Wounds are clean.   Lab Results:  Results for orders placed or performed during the hospital encounter of 11/27/16 (from the past 24 hour(s))  Basic metabolic panel     Status: Abnormal   Collection Time: 11/28/16 10:33 AM  Result Value Ref Range   Sodium 134 (L) 135 - 145 mmol/L   Potassium 3.9 3.5 - 5.1 mmol/L   Chloride 99 (L) 101 - 111 mmol/L   CO2 22 22 - 32 mmol/L   Glucose, Bld 163 (H) 65 - 99 mg/dL   BUN 10 6 - 20 mg/dL   Creatinine, Ser 4.090.94 0.61 - 1.24 mg/dL   Calcium 8.2 (L) 8.9 - 10.3 mg/dL   GFR calc non Af Amer >60 >60 mL/min   GFR calc Af Amer >60 >60 mL/min   Anion gap 13 5 - 15  CBC     Status: Abnormal   Collection Time: 11/28/16 10:33 AM  Result Value Ref Range   WBC 13.7 (H) 4.0 - 10.5 K/uL   RBC 3.21 (L) 4.22 - 5.81 MIL/uL   Hemoglobin 10.4 (L) 13.0 - 17.0 g/dL   HCT 81.132.5 (L) 91.439.0 - 78.252.0 %   MCV 101.2 (H) 78.0 - 100.0 fL   MCH 32.4 26.0 - 34.0 pg   MCHC 32.0 30.0 - 36.0 g/dL   RDW 95.612.8 21.311.5 - 08.615.5 %   Platelets 207 150 - 400 K/uL  Glucose, capillary     Status: Abnormal   Collection Time: 11/28/16 10:36 AM   Result Value Ref Range   Glucose-Capillary 144 (H) 65 - 99 mg/dL     Studies/Results: No results found.  Marland Kitchen. acetaminophen  1,000 mg Oral Q8H  . docusate sodium  100 mg Oral BID  . heparin  5,000 Units Subcutaneous Q8H  . HYDROmorphone   Intravenous Q4H  . pantoprazole (PROTONIX) IV  40 mg Intravenous QHS     Assessment/Plan: s/p Procedure(s): RIGHT INGUINAL HERNIA REPAIR LAPAROSCOPY DIAGNOSTIC INSERTION OF MESH EXPLORATORY LAPAROTOMY   POD #2.  Laparotomy and repair inguinal hernia with mesh Stable Ileus slowly resolving.  Clear liquids only. Mobilize Incentive spirometry Subcutaneous heparin for DVT prophylaxis  @PROBHOSP @  LOS: 2 days    Edye Hainline M 11/29/2016  . .prob

## 2016-11-30 ENCOUNTER — Encounter (HOSPITAL_COMMUNITY): Payer: Self-pay | Admitting: General Practice

## 2016-11-30 LAB — BASIC METABOLIC PANEL
Anion gap: 6 (ref 5–15)
CO2: 28 mmol/L (ref 22–32)
CREATININE: 0.62 mg/dL (ref 0.61–1.24)
Calcium: 8.6 mg/dL — ABNORMAL LOW (ref 8.9–10.3)
Chloride: 103 mmol/L (ref 101–111)
GFR calc Af Amer: >60 mL/min (ref >60)
Glucose, Bld: 96 mg/dL (ref 65–99)
Potassium: 3.9 mmol/L (ref 3.5–5.1)
SODIUM: 137 mmol/L (ref 135–145)

## 2016-11-30 LAB — CBC
HCT: 24.4 % — ABNORMAL LOW (ref 39.0–52.0)
Hemoglobin: 7.9 g/dL — ABNORMAL LOW (ref 13.0–17.0)
MCH: 32.5 pg (ref 26.0–34.0)
MCHC: 32.4 g/dL (ref 30.0–36.0)
MCV: 100.4 fL — ABNORMAL HIGH (ref 78.0–100.0)
PLATELETS: 131 10*3/uL — AB (ref 150–400)
RBC: 2.43 MIL/uL — AB (ref 4.22–5.81)
RDW: 12.4 % (ref 11.5–15.5)
WBC: 6.5 10*3/uL (ref 4.0–10.5)

## 2016-11-30 MED ORDER — PANTOPRAZOLE SODIUM 40 MG PO TBEC
40.0000 mg | DELAYED_RELEASE_TABLET | Freq: Every day | ORAL | Status: DC
  Administered 2016-11-30 – 2016-12-01 (×2): 40 mg via ORAL
  Filled 2016-11-30 (×2): qty 1

## 2016-11-30 MED ORDER — OXYCODONE HCL 5 MG PO TABS
5.0000 mg | ORAL_TABLET | ORAL | Status: DC | PRN
  Administered 2016-11-30 – 2016-12-01 (×2): 10 mg via ORAL
  Filled 2016-11-30 (×3): qty 2

## 2016-11-30 MED ORDER — ENSURE ENLIVE PO LIQD
237.0000 mL | Freq: Two times a day (BID) | ORAL | Status: DC
  Administered 2016-12-01 – 2016-12-02 (×3): 237 mL via ORAL

## 2016-11-30 MED ORDER — ACETAMINOPHEN 325 MG PO TABS
650.0000 mg | ORAL_TABLET | Freq: Three times a day (TID) | ORAL | Status: DC
  Administered 2016-11-30 – 2016-12-02 (×6): 650 mg via ORAL
  Filled 2016-11-30 (×6): qty 2

## 2016-11-30 MED ORDER — HYDROMORPHONE HCL 1 MG/ML IJ SOLN: 0.5000 mg | INTRAMUSCULAR | Status: DC | PRN

## 2016-11-30 NOTE — Progress Notes (Signed)
[facility] Progress Note  3 Days Post-Op   Assessment/Plan  Right Inguinal hernia - S/P open R inguinal hernia repair, insertion of mesh, Dr. Cliffton AstersWhite, 10/19 - having flatus  FEN: Soft diet VTE: SCD's, heparin ID: no abx currently, WBC 13.7 (10/20) CBC pending Foley: none Follow up: Dr. Cliffton AstersWhite 2 weeks, staples need out on 10/31-11/2  DISPO: + flatus, soft diet, DC PCA, PO pain meds, likely home tomorrow, surgical pathology pending    LOS: 3 days    Subjective:  CC; abdominal pain  Pt having flatus, no nausea or vomiting. Tolerated clears. No new complaints.   Objective: Vital signs in last 24 hours: Temp:  [98.1 F (36.7 C)-99.3 F (37.4 C)] 99.3 F (37.4 C) (10/22 0438) Pulse Rate:  [61-72] 69 (10/22 0438) Resp:  [18-24] 18 (10/22 0809) BP: (132-143)/(65-81) 132/71 (10/22 0438) SpO2:  [99 %-100 %] 100 % (10/22 0809) Last BM Date: 11/26/16  Intake/Output from previous day: 10/21 0701 - 10/22 0700 In: -  Out: 3000 [Urine:3000] Intake/Output this shift: No intake/output data recorded.  PE: Gen:  Alert, NAD, pleasant, cooperative Card:  RRR, no M/G/R heard Pulm:  CTA, no W/R/R, effort normal Abd: Soft, hypoactive BS, incisions with staples intact and no signs of infection or bleeding, mild generalized TTP without guarding Skin: no rashes noted, warm and dry   Anti-infectives: Anti-infectives    Start     Dose/Rate Route Frequency Ordered Stop   11/27/16 1400  ceFAZolin (ANCEF) IVPB 2g/100 mL premix  Status:  Discontinued     2 g 200 mL/hr over 30 Minutes Intravenous Every 8 hours 11/27/16 1249 11/27/16 1941   11/27/16 1337  vancomycin (VANCOCIN) 1-5 GM/200ML-% IVPB    Comments:  Clovis CaoFerguson, Laura   : cabinet override      11/27/16 1337 11/28/16 0144   11/27/16 1302  ceFAZolin (ANCEF) 2-4 GM/100ML-% IVPB    Comments:  Schonewitz, Leigh   : cabinet override      11/27/16 1302 11/27/16 1358      Lab Results:   Recent Labs   11/27/16 2057 11/28/16 1033  WBC 15.1* 13.7*  HGB 12.5* 10.4*  HCT 39.0 32.5*  PLT 212 207   BMET  Recent Labs  11/27/16 2057 11/28/16 1033  NA  --  134*  K  --  3.9  CL  --  99*  CO2  --  22  GLUCOSE  --  163*  BUN  --  10  CREATININE 0.84 0.94  CALCIUM  --  8.2*   PT/INR No results for input(s): LABPROT, INR in the last 72 hours. CMP     Component Value Date/Time   NA 134 (L) 11/28/2016 1033   K 3.9 11/28/2016 1033   CL 99 (L) 11/28/2016 1033   CO2 22 11/28/2016 1033   GLUCOSE 163 (H) 11/28/2016 1033   BUN 10 11/28/2016 1033   CREATININE 0.94 11/28/2016 1033   CALCIUM 8.2 (L) 11/28/2016 1033   PROT 6.9 11/27/2016 0026   ALBUMIN 4.3 11/27/2016 0026   AST 25 11/27/2016 0026   ALT 15 (L) 11/27/2016 0026   ALKPHOS 65 11/27/2016 0026   BILITOT 1.6 (H) 11/27/2016 0026   GFRNONAA >60 11/28/2016 1033   GFRAA >60 11/28/2016 1033   Lipase     Component Value Date/Time   LIPASE 24 11/04/2016 0200    Studies/Results: No results found.    Jerre SimonJessica L Arizbeth Cawthorn , [facility] 11/30/2016, 9:54 AM Pager: [phone] Consults: [phone] Mon-Fri  7:00 am-4:30 pm Sat-Sun 7:00 am-11:30 am

## 2016-12-01 LAB — CBC
HEMATOCRIT: 29.4 % — AB (ref 39.0–52.0)
HEMOGLOBIN: 9.3 g/dL — AB (ref 13.0–17.0)
MCH: 31.8 pg (ref 26.0–34.0)
MCHC: 31.6 g/dL (ref 30.0–36.0)
MCV: 100.7 fL — AB (ref 78.0–100.0)
Platelets: 241 10*3/uL (ref 150–400)
RBC: 2.92 MIL/uL — ABNORMAL LOW (ref 4.22–5.81)
RDW: 12.5 % (ref 11.5–15.5)
WBC: 8.6 10*3/uL (ref 4.0–10.5)

## 2016-12-01 MED ORDER — ACETAMINOPHEN 325 MG PO TABS: 650.0000 mg | ORAL_TABLET | Freq: Three times a day (TID) | ORAL | Status: DC | PRN

## 2016-12-01 MED ORDER — SODIUM CHLORIDE 0.9 % IV SOLN: Freq: Once | INTRAVENOUS | Status: DC

## 2016-12-01 MED ORDER — OXYCODONE HCL 5 MG PO TABS: 10.0000 mg | ORAL_TABLET | Freq: Four times a day (QID) | ORAL | 0 refills | Status: DC | PRN

## 2016-12-01 MED ORDER — DOCUSATE SODIUM 100 MG PO CAPS: 100.0000 mg | ORAL_CAPSULE | Freq: Two times a day (BID) | ORAL | 0 refills | Status: DC | PRN

## 2016-12-01 NOTE — Plan of Care (Signed)
Problem: Food- and Nutrition-Related Knowledge Deficit (NB-1.1) Goal: Nutrition education Formal process to instruct or train a patient/client in a skill or to impart knowledge to help patients/clients voluntarily manage or modify food choices and eating behavior to maintain or improve health. Outcome: Adequate for Discharge  RD provided education regarding High-Calorie, High-Protein nutrition therapy.  RD provided "High-Calorie High-Protein Nutrition Therapy" handout from the Academy of [employer]. Reviewed patient's dietary recall. Provided examples on ways to increase caloric density of foods and beverages frequently consumed by the patient. Also provided ideas to promote variety and to incorporate additional nutrient dense foods into patient's diet. Discussed eating small frequent meals and snacks to assist in increasing overall po intake. Teach back method used.  Expect fair to good compliance.  RD following pt for acute nutrition issues; please refer to initial nutrition for further details.  Ellysa Parrack A. Mayford KnifeWilliams, RD, LDN, CDE Pager: [phone] After hours Pager: [phone]

## 2016-12-01 NOTE — Discharge Summary (Signed)
[facility] Discharge Summary   Patient ID: Keith Romero MRN: [id number] DOB/AGE: 52-Apr-1967 52 y.o.  Admit date: 11/27/2016 Discharge date: 12/02/2016  Discharge Diagnosis Patient Active Problem List   Diagnosis Date Noted  . Incarcerated right inguinal hernia 11/27/2016  .  SBO     Consultants None   Procedures Dr. Marin Olp (11/27/16) - open right inguinal hernia repair, exploratory laparotomy   Hospital Course:  51 y.o. AA male with PMH multiple ED visits for incarceration of right inguinal hernia who presented to [facility] 11/27/16 with recurrent right inguinal pain and trouble reducing his hernia. Associated sxs included nausea. The patient was admitted to the hospital for the above procedure and intraoperatively was found to have a very thick and edematous hernia sack. The surgeon proceeded with diagnostic laparoscopy due to concerns about viability of the cecum, followed by exploratory laparotomy where he confirmed all bowel/mesentery was viable. Post-operatively the patients ileus slowly resolved and diet advanced as tolerated. On POD#5 the patients vitals were stable, incisions clean and dry, mobilizing, pain controlled, tolerating PO intake, and having bowel function. He was discharged home with outpatient follow up as below. He knows to call with questions/concerns. Pt was discharged in good condition.  PE: Gen: Alert, NAD, pleasant, cooperative Card: RRR, no M/G/R heard Pulm: CTA, no W/R/R, rate and effort normal Abd: Soft, no distention, + BS, incisions with staples intact appear well healing and no drainage. No surrounding erythema. Very mild generalized TTP around incisions without guarding Skin: no rashes noted, warm and dry  Allergies as of 12/01/2016      Reactions   Penicillins Hives      Medication List    STOP taking these medications   ciprofloxacin 500 MG tablet Commonly known as:  CIPRO   nitrofurantoin (macrocrystal-monohydrate)  100 MG capsule Commonly known as:  MACROBID   omeprazole 20 MG capsule Commonly known as:  PRILOSEC   promethazine 25 MG tablet Commonly known as:  PHENERGAN     TAKE these medications   acetaminophen 325 MG tablet Commonly known as:  TYLENOL Take 2 tablets (650 mg total) by mouth every 8 (eight) hours as needed.   docusate sodium 100 MG capsule Commonly known as:  COLACE Take 1 capsule (100 mg total) by mouth 2 (two) times daily as needed for mild constipation. What changed:  when to take this  reasons to take this  additional instructions   oxyCODONE 5 MG immediate release tablet Commonly known as:  Oxy IR/ROXICODONE Take 2 tablets (10 mg total) by mouth every 6 (six) hours as needed for moderate pain (5mg  for moderate pain, 10mg  for severe pain).   polyethylene glycol packet Commonly known as:  MIRALAX / GLYCOLAX Take 17 g by mouth daily.        Follow-up Information    Andria Meuse, MD. Go on 12/15/2016.   Specialty:  General Surgery Why:  at 0930 for your follow up appointment. please arrive 15 minutes prior to complete paperwork Contact information: [address] [place] [address] [phone]        [facility], [place] Follow up on 12/10/2016.   Specialty:  General Surgery Why:  at 2:00 PM for staple removal. please arrive 30 minutes early. Contact information: [address] [place] [address] [phone]       Gastroenterology, [facility]. Schedule an appointment as soon as possible for a visit in 2 week(s).   Why:  for colonoscopy, when you  call they will let you know if you need a referral from a primary care provider based on your insurance Contact information: [address] [place] [address] [phone]           Signed: Mattie MarlinJessica Focht, [facility] Pager [phone]

## 2016-12-01 NOTE — Progress Notes (Signed)
[facility]/Tra[facility] Progress Note  4 Days Post-Op   Assessment/Plan Right Inguinal hernia - S/P open R inguinal hernia repair, insertion of mesh, Dr. Cliffton Asters, 10/19 - having flatus, tolerating soft diet  Acute blood loss anemia - Hg dropped to 7.9 from 10.4, will recheck this afternoon. Likely home tomorrow pending Hg  FEN: Soft diet VTE: SCD's, heparin ID: no abx currently, WBC 6.5 (10/23)  Foley: none Follow up: Dr. Cliffton Asters 2 weeks, staples need out on 10/31-11/2  DISPO: + flatus, soft diet, surgical path showed hernia sac, CBC this afternoon to check Hg     LOS: 4 days    Subjective:  CC: tired  Pt states he has been more tired than normal. Still having flatus but no BM. No nausea or vomiting. Tolerating diet. Very mild intermittent abdominal pain. He has not need much pain meds.   Objective: Vital signs in last 24 hours: Temp:  [98.5 F (36.9 C)-99 F (37.2 C)] 99 F (37.2 C) (10/23 0328) Pulse Rate:  [76-85] 76 (10/23 0328) Resp:  [18] 18 (10/22 2119) BP: (123-144)/(77-81) 123/77 (10/23 0328) SpO2:  [100 %] 100 % (10/23 0328) Last BM Date: 11/26/16  Intake/Output from previous day: 10/22 0701 - 10/23 0700 In: 1040 [P.O.:1040] Out: 800 [Urine:800] Intake/Output this shift: Total I/O In: 360 [P.O.:360] Out: 300 [Urine:300]  PE: Gen:  Alert, NAD, pleasant, cooperative Card:  RRR, no M/G/R heard Pulm:  CTA, no W/R/R, effort normal Abd: Soft, no distention, + BS, incisions C/D/I, mild generalized TTP around incisions without guarding Skin: no rashes noted, warm and dry  Anti-infectives: Anti-infectives    Start     Dose/Rate Route Frequency Ordered Stop   11/27/16 1400  ceFAZolin (ANCEF) IVPB 2g/100 mL premix  Status:  Discontinued     2 g 200 mL/hr over 30 Minutes Intravenous Every 8 hours 11/27/16 1249 11/27/16 1941   11/27/16 1337  vancomycin (VANCOCIN) 1-5 GM/200ML-% IVPB    Comments:  Clovis Cao   : cabinet override      11/27/16  1337 11/28/16 0144   11/27/16 1302  ceFAZolin (ANCEF) 2-4 GM/100ML-% IVPB    Comments:  Schonewitz, Leigh   : cabinet override      11/27/16 1302 11/27/16 1358      Lab Results:   Recent Labs  11/28/16 1033 11/30/16 0949  WBC 13.7* 6.5  HGB 10.4* 7.9*  HCT 32.5* 24.4*  PLT 207 131*   BMET  Recent Labs  11/28/16 1033 11/30/16 0949  NA 134* 137  K 3.9 3.9  CL 99* 103  CO2 22 28  GLUCOSE 163* 96  BUN 10 <5*  CREATININE 0.94 0.62  CALCIUM 8.2* 8.6*   PT/INR No results for input(s): LABPROT, INR in the last 72 hours. CMP     Component Value Date/Time   NA 137 11/30/2016 0949   K 3.9 11/30/2016 0949   CL 103 11/30/2016 0949   CO2 28 11/30/2016 0949   GLUCOSE 96 11/30/2016 0949   BUN <5 (L) 11/30/2016 0949   CREATININE 0.62 11/30/2016 0949   CALCIUM 8.6 (L) 11/30/2016 0949   PROT 6.9 11/27/2016 0026   ALBUMIN 4.3 11/27/2016 0026   AST 25 11/27/2016 0026   ALT 15 (L) 11/27/2016 0026   ALKPHOS 65 11/27/2016 0026   BILITOT 1.6 (H) 11/27/2016 0026   GFRNONAA >60 11/30/2016 0949   GFRAA >60 11/30/2016 0949   Lipase     Component Value Date/Time   LIPASE 24 11/04/2016 0200  Studies/Results: No results found.    Jerre SimonJessica L Delron Comer , [facility] 12/01/2016, 9:32 AM Pager: [phone] Consults: [phone] Mon-Fri 7:00 am-4:30 pm Sat-Sun 7:00 am-11:30 am

## 2016-12-01 NOTE — Progress Notes (Signed)
Initial Nutrition Assessment  DOCUMENTATION CODES:   Severe malnutrition in context of chronic illness  INTERVENTION:   -Continue Ensure Enlive po BID, each supplement provides 350 kcal and 20 grams of protein -Educated pt on high calorie, high protein diet; "High Calorie, High Protein Nutrition Therapy" handout from [employer] Nutrition Care Manual Provided; see education note for further details  NUTRITION DIAGNOSIS:   Malnutrition (Severe) related to acute illness (rt inguinal hernia) as evidenced by moderate depletion of body fat, mild depletion of body fat, mild depletion of muscle mass, moderate depletions of muscle mass.  GOAL:   Patient will meet greater than or equal to 90% of their needs  MONITOR:   PO intake, Supplement acceptance, Labs, Weight trends, Skin, I & O's  REASON FOR ASSESSMENT:   Malnutrition Screening Tool    ASSESSMENT:   50M with hx of multiple ER visits for incarceration of his R inguinal hernia, returns with the same problems. Has not been successful at getting in to see us as an outpatient due to scheduling issues. Will proceed with inguinal hernia repair today  s/p Procedure(s) 11/27/16: RIGHT INGUINAL HERNIA REPAIR LAPAROSCOPY DIAGNOSTIC INSERTION OF MESH EXPLORATORY LAPAROTOMY  Spoke with pt at bedside. He reports poor oral intake and weight loss over the past 3 months, related to hernia. He shares that he restricted his food intake to "soft foods" (eliminating fried foods and consuming lean proteins and high fiber foods). Additionally, he reports not feeling like eating due to laxative use. He reports intake has improved since surgery, but has not been completing meals "because I don't want to over do it". Pt has been consuming ordered Ensure supplements, which he likes.   Pt estimates he has lost about 15-20# within the past 3 months. Per wt hx, pt has experienced a 26# (13.6%) wt loss over the past month, however, question accuracy of wt (UBW  between 170-175#).   Nutrition-Focused physical exam completed. Findings are mild to moderate fat depletion, mild to moderate muscle depletion, and no edema.   Pt very concerned regarding weight loss and is motivated to regain lost weight. Discussed with pt how to increase calories and protein in his diet. Handout also provided. See education note for further details.   Labs reviewed: CBGS: 144.   Diet Order:  DIET SOFT Room service appropriate? Yes; Fluid consistency: Thin  Skin:   (rt groin incision)  Last BM:  11/26/16  Height:   Ht Readings from Last 1 Encounters:  11/27/16 6' (1.829 m)    Weight:   Wt Readings from Last 1 Encounters:  11/27/16 164 lb 6.4 oz (74.6 kg)    Ideal Body Weight:  80.9 kg  BMI:  Body mass index is 22.3 kg/m.  Estimated Nutritional Needs:   Kcal:  2200-2400  Protein:  115-130 grams  Fluid:  2.2-2.4 L  EDUCATION NEEDS:   Education needs addressed  Aashna Matson A. Mayford KnifeWilliams, RD, LDN, CDE Pager: [phone] After hours Pager: [phone]

## 2016-12-02 MED ORDER — INFLUENZA VAC SPLIT QUAD 0.5 ML IM SUSY
0.5000 mL | PREFILLED_SYRINGE | INTRAMUSCULAR | Status: AC
  Administered 2016-12-02: 0.5 mL via INTRAMUSCULAR

## 2016-12-02 NOTE — Progress Notes (Signed)
Pt discharged to home, discharge instructions reviewed with patient. Pt ambulated off unit by self.  He was given flu shot prior to discharge.

## 2016-12-02 NOTE — Discharge Instructions (Signed)
[facility]      [facility], [place] [phone]  OPEN ABDOMINAL SURGERY: POST OP INSTRUCTIONS  Always review your discharge instruction sheet given to you by the facility where your surgery was performed.  IF YOU HAVE DISABILITY OR FAMILY LEAVE FORMS, YOU MUST BRING THEM TO THE OFFICE FOR PROCESSING.  PLEASE DO NOT GIVE THEM TO YOUR DOCTOR.  1. A prescription for pain medication may be given to you upon discharge.  Take your pain medication as prescribed, if needed.  If narcotic pain medicine is not needed, then you may take acetaminophen (Tylenol) or ibuprofen (Advil) as needed. 2. Take your usually prescribed medications unless otherwise directed. 3. If you need a refill on your pain medication, please contact your pharmacy. They will contact our office to request authorization.  Prescriptions will not be filled after 5pm or on week-ends. 4. You should follow a light diet the first few days after arrival home, such as soup and crackers, pudding, etc.unless your doctor has advised otherwise. A high-fiber, low fat diet can be resumed as tolerated.   Be sure to include lots of fluids daily. Most patients will experience some swelling and bruising on the chest and neck area.  Ice packs will help.  Swelling and bruising can take several days to resolve 5. Most patients will experience some swelling and bruising in the area of the incision. Ice pack will help. Swelling and bruising can take several days to resolve..  6. It is common to experience some constipation if taking pain medication after surgery.  Increasing fluid intake and taking a stool softener (Colace) will usually help or prevent this problem from occurring.  A mild laxative (Milk of Magnesia or Miralax) should be taken according to package directions if there are no bowel movements after 48 hours. If you do not have a bowel movement in 3-4 days after discharge, try a bottle of magnesium citrate. If that does not work please call our  office.  7.  You may have steri-strips (small skin tapes) in place directly over the incision.  These strips should be left on the skin for 7-10 days.  If your surgeon used skin glue on the incision, you may shower in 24 hours.  The glue will flake off over the next 2-3 weeks.  Any sutures or staples will be removed at the office during your follow-up visit. You may find that a light gauze bandage over your incision may keep your staples from being rubbed or pulled. You may shower and replace the bandage daily. 8. ACTIVITIES:  You may resume regular (light) daily activities beginning the next day--such as daily self-care, walking, climbing stairs--gradually increasing activities as tolerated.  You may have sexual intercourse when it is comfortable.  Refrain from any heavy lifting or straining until approved by your doctor. a. You may drive when you no longer are taking prescription pain medication, you can comfortably wear a seatbelt, and you can safely maneuver your car and apply brakes b. Return to Work: once approved by your doctor 9. You should see your doctor in the office for a follow-up appointment approximately two weeks after your surgery.  Make sure that you call for this appointment within a day or two after you arrive home to insure a convenient appointment time. OTHER INSTRUCTIONS:  _____________________________________________________________ _____________________________________________________________  WHEN TO CALL YOUR DOCTOR: 1. Fever over 101.0 2. Inability to urinate 3. Nausea and/or vomiting 4. Extreme swelling or bruising 5. Continued bleeding from incision. 6. Increased pain,  redness, or drainage from the incision. 7. Difficulty swallowing or breathing 8. Muscle cramping or spasms. 9. Numbness or tingling in hands or feet or around lips.  The clinic staff is available to answer your questions during regular business hours.  Please dont hesitate to call and ask to speak to  one of the nurses if you have concerns.  For further questions, please visit [email or web address]

## 2016-12-10 ENCOUNTER — Emergency Department (HOSPITAL_COMMUNITY)
Admission: EM | Admit: 2016-12-10 | Discharge: 2016-12-10 | Disposition: A | Discharge status: Home / Self Care | Payer: BLUE CROSS/BLUE SHIELD | Attending: Emergency Medicine | Admitting: Emergency Medicine

## 2016-12-10 ENCOUNTER — Encounter (HOSPITAL_COMMUNITY): Payer: Self-pay | Admitting: Nurse Practitioner

## 2016-12-10 DIAGNOSIS — Z79899 Other long term (current) drug therapy: Secondary | ICD-10-CM | POA: Diagnosis not present

## 2016-12-10 DIAGNOSIS — Z4802 Encounter for removal of sutures: Secondary | ICD-10-CM | POA: Insufficient documentation

## 2016-12-10 DIAGNOSIS — K4090 Unilateral inguinal hernia, without obstruction or gangrene, not specified as recurrent: Secondary | ICD-10-CM | POA: Insufficient documentation

## 2016-12-10 DIAGNOSIS — Z5189 Encounter for other specified aftercare: Secondary | ICD-10-CM | POA: Diagnosis not present

## 2016-12-10 NOTE — ED Triage Notes (Signed)
Pt had inguinal hernia sx done on 10/19 was supposed to have staples removed today but didn't have money for copay, so was instructed to get removed in ER. Pt denies fevers, chills, drainage, or pain. Denies redness, streaking.

## 2016-12-10 NOTE — ED Provider Notes (Signed)
[facility] EMERGENCY DEPARTMENT Provider Note   CSN: [id number] Arrival date & time: 12/10/16  4098     History   Chief Complaint Chief Complaint  Patient presents with  . Suture / Staple Removal    HPI Ritik Stavola is a 51 y.o. male.  The history is provided by the patient. No language interpreter was used.  Suture / Staple Removal  This is a new problem. The current episode started more than 1 week ago. The problem occurs constantly. The problem has not changed since onset.Nothing aggravates the symptoms. Nothing relieves the symptoms. He has tried nothing for the symptoms.  Pt reports he needs staples out.  Pt states he can not afford copay to see the surgeon who repaired his hernia.  Pt reports he has been out of work and does not have money to see  Past Medical History:  Diagnosis Date  . Heart murmur    "when I was young" (11/30/2016)    Patient Active Problem List   Diagnosis Date Noted  . Reducible right inguinal hernia 11/27/2016    Past Surgical History:  Procedure Laterality Date  . EXPLORATORY LAPAROTOMY  11/27/2016  . INGUINAL HERNIA REPAIR Right 11/27/2016  . INGUINAL HERNIA REPAIR Right 11/27/2016   Procedure: RIGHT INGUINAL HERNIA REPAIR;  Surgeon: Andria Meuse, MD;  Location: [facility] OR;  Service: General;  Laterality: Right;  . INSERTION OF MESH Right 11/27/2016   Procedure: INSERTION OF MESH;  Surgeon: Andria Meuse, MD;  Location: [facility] OR;  Service: General;  Laterality: Right;  . LAPAROSCOPY N/A 11/27/2016   Procedure: LAPAROSCOPY DIAGNOSTIC;  Surgeon: Andria Meuse, MD;  Location: [facility];  Service: General;  Laterality: N/A;  . LAPAROTOMY N/A 11/27/2016   Procedure: EXPLORATORY LAPAROTOMY;  Surgeon: Andria Meuse, MD;  Location: [facility] OR;  Service: General;  Laterality: N/A;       Home Medications    Prior to Admission medications   Medication Sig Start Date End Date Taking? Authorizing Provider    acetaminophen (TYLENOL) 325 MG tablet Take 2 tablets (650 mg total) by mouth every 8 (eight) hours as needed. 12/01/16   Adam Phenix, PA-C  docusate sodium (COLACE) 100 MG capsule Take 1 capsule (100 mg total) by mouth 2 (two) times daily as needed for mild constipation. 12/01/16   Adam Phenix, PA-C  oxyCODONE (OXY IR/ROXICODONE) 5 MG immediate release tablet Take 2 tablets (10 mg total) by mouth every 6 (six) hours as needed for moderate pain (5mg  for moderate pain, 10mg  for severe pain). 12/01/16   Adam Phenix, PA-C  polyethylene glycol (MIRALAX / GLYCOLAX) packet Take 17 g by mouth daily. 11/04/16   Fayrene Helper, PA-C    Family History No family history on file.  Social History Social History  Substance Use Topics  . Smoking status: Never Smoker  . Smokeless tobacco: Never Used  . Alcohol use 1.2 oz/week    2 Cans of beer per week     Allergies   Penicillins   Review of Systems Review of Systems  All other systems reviewed and are negative.    Physical Exam Updated Vital Signs BP 135/73   Pulse 65   Temp 98 F (36.7 C) (Oral)   Resp 16   Ht 6' (1.829 m)   Wt 81.6 kg (180 lb)   SpO2 100%   BMI 24.41 kg/m   Physical Exam  Constitutional: He appears well-developed and well-nourished.  Skin: Skin is warm.  Healing incision    Psychiatric: He has a normal mood and affect.  Nursing note and vitals reviewed.    ED Treatments / Results  Labs (all labs ordered are listed, but only abnormal results are displayed) Labs Reviewed - No data to display  EKG  EKG Interpretation None       Radiology No results found.  Procedures Procedures (including critical care time)  Medications Ordered in ED Medications - No data to display   Initial Impression / Assessment and Plan / ED Course  I have reviewed the triage vital signs and the nursing notes.  Pertinent labs & imaging results that were available during my care of the patient were  reviewed by me and considered in my medical decision making (see chart for details).     Staples removed.  Pt will follow up with surgeon as soon as he can.  Final Clinical Impressions(s) / ED Diagnoses   Final diagnoses:  Removal of staples  Encounter for wound re-check    New Prescriptions Discharge Medication List as of 12/10/2016 10:16 AM    An After Visit Summary was printed and given to the patient.    Elson AreasSofia, Aleem Elza K, PA-C 12/10/16 1749    Bethann BerkshireZammit, Joseph, MD 12/11/16 [phone]

## 2016-12-10 NOTE — Discharge Instructions (Signed)
Return if any problems.

## 2017-02-27 ENCOUNTER — Encounter (HOSPITAL_COMMUNITY): Payer: Self-pay | Admitting: Emergency Medicine

## 2017-02-27 ENCOUNTER — Emergency Department (HOSPITAL_COMMUNITY)
Admission: EM | Admit: 2017-02-27 | Discharge: 2017-02-27 | Disposition: A | Discharge status: Home / Self Care | Payer: BLUE CROSS/BLUE SHIELD | Attending: Emergency Medicine | Admitting: Emergency Medicine

## 2017-02-27 DIAGNOSIS — N390 Urinary tract infection, site not specified: Secondary | ICD-10-CM | POA: Diagnosis not present

## 2017-02-27 DIAGNOSIS — R300 Dysuria: Secondary | ICD-10-CM | POA: Diagnosis present

## 2017-02-27 DIAGNOSIS — Z711 Person with feared health complaint in whom no diagnosis is made: Secondary | ICD-10-CM | POA: Insufficient documentation

## 2017-02-27 DIAGNOSIS — Z79899 Other long term (current) drug therapy: Secondary | ICD-10-CM | POA: Diagnosis not present

## 2017-02-27 LAB — URINALYSIS, ROUTINE W REFLEX MICROSCOPIC
BILIRUBIN URINE: NEGATIVE
GLUCOSE, UA: NEGATIVE mg/dL
HGB URINE DIPSTICK: NEGATIVE
Ketones, ur: NEGATIVE mg/dL
Nitrite: NEGATIVE
PH: 5 (ref 5.0–8.0)
Protein, ur: NEGATIVE mg/dL
Specific Gravity, Urine: 1.017 (ref 1.005–1.030)

## 2017-02-27 MED ORDER — LIDOCAINE HCL (PF) 1 % IJ SOLN
INTRAMUSCULAR | Status: AC
  Administered 2017-02-27: 5 mL
  Filled 2017-02-27: qty 5

## 2017-02-27 MED ORDER — CEFTRIAXONE SODIUM 250 MG IJ SOLR
250.0000 mg | Freq: Once | INTRAMUSCULAR | Status: AC
  Administered 2017-02-27: 250 mg via INTRAMUSCULAR
  Filled 2017-02-27: qty 250

## 2017-02-27 MED ORDER — AZITHROMYCIN 250 MG PO TABS
1000.0000 mg | ORAL_TABLET | Freq: Once | ORAL | Status: AC
Start: 2017-02-27 — End: 2017-02-27
  Administered 2017-02-27: 1000 mg via ORAL
  Filled 2017-02-27: qty 4

## 2017-02-27 MED ORDER — CEPHALEXIN 500 MG PO CAPS: 500.0000 mg | ORAL_CAPSULE | Freq: Two times a day (BID) | ORAL | 0 refills | Status: AC

## 2017-02-27 NOTE — Discharge Instructions (Signed)
Take Keflex twice daily for 7 days. We will contact you with the results of the remaining lab work when it is available. Return to ED for worsening symptoms, severe abdominal pain, severe headache, vision changes, trouble urinating.

## 2017-02-27 NOTE — ED Provider Notes (Signed)
[facility] EMERGENCY DEPARTMENT Provider Note   CSN: [id number] Arrival date & time: 02/27/17  1028     History   Chief Complaint Chief Complaint  Patient presents with  . Urinary Tract Infection  . Nausea  . Headache    HPI Keith Romero is a 52 y.o. male who presents to ED for evaluation of multiple complaints.  His first complaint is headaches which she states is related to stress from work and lack of sleep.  States that they usually resolve with Motrin.  He did have a headache today which has now resolved with Motrin.  Denies any changes in frequency or characteristics of headaches, head injuries, vision changes, numbness in arms or legs or trouble walking. His next complaint is penile discharge and dysuria.  States that his girlfriend had a UTI and he is unsure if she passed a UTI onto him.  He is also concerned for STDs.  He denies any rashes, lesions, abdominal pain, bowel changes or fever.  HPI  Past Medical History:  Diagnosis Date  . Heart murmur    "when I was young" (11/30/2016)    Patient Active Problem List   Diagnosis Date Noted  . Reducible right inguinal hernia 11/27/2016    Past Surgical History:  Procedure Laterality Date  . EXPLORATORY LAPAROTOMY  11/27/2016  . INGUINAL HERNIA REPAIR Right 11/27/2016  . INGUINAL HERNIA REPAIR Right 11/27/2016   Procedure: RIGHT INGUINAL HERNIA REPAIR;  Surgeon: Andria Meuse, MD;  Location: [facility] OR;  Service: General;  Laterality: Right;  . INSERTION OF MESH Right 11/27/2016   Procedure: INSERTION OF MESH;  Surgeon: Andria Meuse, MD;  Location: [facility] OR;  Service: General;  Laterality: Right;  . LAPAROSCOPY N/A 11/27/2016   Procedure: LAPAROSCOPY DIAGNOSTIC;  Surgeon: Andria Meuse, MD;  Location: [facility] OR;  Service: General;  Laterality: N/A;  . LAPAROTOMY N/A 11/27/2016   Procedure: EXPLORATORY LAPAROTOMY;  Surgeon: Andria Meuse, MD;  Location: [facility] OR;  Service: General;   Laterality: N/A;       Home Medications    Prior to Admission medications   Medication Sig Start Date End Date Taking? Authorizing Provider  acetaminophen (TYLENOL) 325 MG tablet Take 2 tablets (650 mg total) by mouth every 8 (eight) hours as needed. 12/01/16   Adam Phenix, PA-C  cephALEXin (KEFLEX) 500 MG capsule Take 1 capsule (500 mg total) by mouth 2 (two) times daily for 7 days. 02/27/17 03/06/17  Kaleth Koy, PA-C  docusate sodium (COLACE) 100 MG capsule Take 1 capsule (100 mg total) by mouth 2 (two) times daily as needed for mild constipation. 12/01/16   Adam Phenix, PA-C  oxyCODONE (OXY IR/ROXICODONE) 5 MG immediate release tablet Take 2 tablets (10 mg total) by mouth every 6 (six) hours as needed for moderate pain (5mg  for moderate pain, 10mg  for severe pain). 12/01/16   Adam Phenix, PA-C  polyethylene glycol (MIRALAX / GLYCOLAX) packet Take 17 g by mouth daily. 11/04/16   Fayrene Helper, PA-C    Family History History reviewed. No pertinent family history.  Social History Social History   Tobacco Use  . Smoking status: Never Smoker  . Smokeless tobacco: Never Used  Substance Use Topics  . Alcohol use: Yes    Alcohol/week: 1.2 oz    Types: 2 Cans of beer per week  . Drug use: Yes    Types: Marijuana    Comment: 11/30/2016 "last used last week"     Allergies  Penicillins   Review of Systems Review of Systems  Constitutional: Negative for chills and fever.  Gastrointestinal: Positive for nausea. Negative for abdominal pain and vomiting.  Genitourinary: Positive for discharge and dysuria. Negative for flank pain, genital sores, hematuria, penile pain, scrotal swelling, testicular pain and urgency.  Skin: Negative for rash.  Neurological: Positive for headaches. Negative for weakness and numbness.     Physical Exam Updated Vital Signs BP (!) 156/88 (BP Location: Right Arm)   Pulse (!) 59   Temp (!) 97.2 F (36.2 C) (Oral)   Resp 20    SpO2 100%   Physical Exam  Constitutional: He is oriented to person, place, and time. He appears well-developed and well-nourished. No distress.  Nontoxic appearing and in no acute distress.  HENT:  Head: Normocephalic and atraumatic.  Eyes: Conjunctivae and EOM are normal. No scleral icterus.  Neck: Normal range of motion.  Pulmonary/Chest: Effort normal. No respiratory distress.  Genitourinary:  Genitourinary Comments: Patient declines GU exam at this time.  Neurological: He is alert and oriented to person, place, and time. No cranial nerve deficit or sensory deficit. He exhibits normal muscle tone. Coordination normal.  Pupils reactive. No facial asymmetry noted. Cranial nerves appear grossly intact. Sensation intact to light touch on face, BUE and BLE. Strength 5/5 in BUE and BLE. Normal finger to nose coordination bilaterally.  Skin: No rash noted. He is not diaphoretic.  Psychiatric: He has a normal mood and affect.  Nursing note and vitals reviewed.    ED Treatments / Results  Labs (all labs ordered are listed, but only abnormal results are displayed) Labs Reviewed  URINALYSIS, ROUTINE W REFLEX MICROSCOPIC - Abnormal; Notable for the following components:      Result Value   APPearance HAZY (*)    Leukocytes, UA LARGE (*)    Bacteria, UA RARE (*)    Squamous Epithelial / LPF 0-5 (*)    All other components within normal limits  GC/CHLAMYDIA PROBE AMP ([place]) NOT AT [facility]    EKG  EKG Interpretation None       Radiology No results found.  Procedures Procedures (including critical care time)  Medications Ordered in ED Medications  cefTRIAXone (ROCEPHIN) injection 250 mg (not administered)  azithromycin (ZITHROMAX) tablet 1,000 mg (not administered)     Initial Impression / Assessment and Plan / ED Course  I have reviewed the triage vital signs and the nursing notes.  Pertinent labs & imaging results that were available during my care of the patient  were reviewed by me and considered in my medical decision making (see chart for details).     Patient presents to ED for evaluation of multiple complaints.  His first complaint is headache.  No headache at this time as he took Motrin prior to arrival.  States that his headache usually go away with Motrin.  No head injuries, deficits on neurological exam or concern for infectious or vascular cause of his headache.  I suspect that it is job and stress related like he states. His next complaint is penile discharge and dysuria.'s is concerned that he caught a UTI from his girlfriend.  Patient declines GU exam at this time.  We will treat empirically for gonorrhea and chlamydia.  Also evidence of UTI with leukocytes on UA.  Will treat with Keflex.  Patient appears stable for discharge at this time.  Strict return precautions given.  Final Clinical Impressions(s) / ED Diagnoses   Final diagnoses:  Lower urinary tract  infectious disease  Concern about STD in male without diagnosis    ED Discharge Orders        Ordered    cephALEXin (KEFLEX) 500 MG capsule  2 times daily     02/27/17 1325     Portions of this note were generated with Dragon dictation software. Dictation errors may occur despite best attempts at proofreading.    Dietrich Pates, PA-C 02/27/17 1354    Loren Racer, MD 02/28/17 [phone]

## 2017-02-27 NOTE — ED Triage Notes (Signed)
PT walked out with out RX for Keflex . The Pt was call on his mobile #. Pt will come back for RX.

## 2017-02-27 NOTE — ED Triage Notes (Addendum)
Pt checked in for headaches, but states he does not have one right now and took motrin that relieved it. Upon assessment pt states "my girl friend has a UTI and she gave it to me." Pt states it burns when he pees. States "some yellow discharge from penis initially but none at present." Pt also has headaches that he takes motrin for and states this is frequent for him due to lack of sleep and work stress. Pt also states he has some nausea since Wednesday, denies abdominal pain.

## 2017-02-27 NOTE — ED Notes (Signed)
Declined W/C at D/C and was escorted to lobby by RN. 

## 2017-03-01 LAB — GC/CHLAMYDIA PROBE AMP (~~LOC~~) NOT AT ARMC
Chlamydia: NEGATIVE
Neisseria Gonorrhea: POSITIVE — AB

## 2018-07-02 ENCOUNTER — Encounter (HOSPITAL_COMMUNITY): Payer: Self-pay | Admitting: Emergency Medicine

## 2018-07-02 ENCOUNTER — Other Ambulatory Visit: Payer: Self-pay

## 2018-07-02 ENCOUNTER — Emergency Department (HOSPITAL_COMMUNITY)
Admission: EM | Admit: 2018-07-02 | Discharge: 2018-07-02 | Disposition: A | Discharge status: Home / Self Care | Payer: BLUE CROSS/BLUE SHIELD | Attending: Emergency Medicine | Admitting: Emergency Medicine

## 2018-07-02 ENCOUNTER — Emergency Department (HOSPITAL_COMMUNITY): Payer: BLUE CROSS/BLUE SHIELD

## 2018-07-02 DIAGNOSIS — Z79899 Other long term (current) drug therapy: Secondary | ICD-10-CM | POA: Diagnosis not present

## 2018-07-02 DIAGNOSIS — N433 Hydrocele, unspecified: Secondary | ICD-10-CM | POA: Diagnosis not present

## 2018-07-02 DIAGNOSIS — Z202 Contact with and (suspected) exposure to infections with a predominantly sexual mode of transmission: Secondary | ICD-10-CM | POA: Diagnosis not present

## 2018-07-02 DIAGNOSIS — N5089 Other specified disorders of the male genital organs: Secondary | ICD-10-CM | POA: Diagnosis present

## 2018-07-02 LAB — URINALYSIS, ROUTINE W REFLEX MICROSCOPIC
Bilirubin Urine: NEGATIVE
Glucose, UA: NEGATIVE mg/dL
Hgb urine dipstick: NEGATIVE
Ketones, ur: NEGATIVE mg/dL
Leukocytes,Ua: NEGATIVE
Nitrite: NEGATIVE
Protein, ur: NEGATIVE mg/dL
Specific Gravity, Urine: 1.013 (ref 1.005–1.030)
pH: 5 (ref 5.0–8.0)

## 2018-07-02 MED ORDER — METRONIDAZOLE 500 MG PO TABS: 500.0000 mg | ORAL_TABLET | Freq: Two times a day (BID) | ORAL | 0 refills | Status: DC

## 2018-07-02 NOTE — ED Provider Notes (Signed)
[facility] EMERGENCY DEPARTMENT Provider Note   CSN: [id number] Arrival date & time: 07/02/18  4098    History   Chief Complaint Chief Complaint  Patient presents with  . Exposure to STD    HPI Keith Romero is a 53 y.o. male status post right inguinal hernia repair, presenting to the emergency department with right testicular swelling and discomfort that began a few days ago.  Patient states he was unsure if this was normal after having hernia repair in October 2018.  He states his right testicle is swollen and somewhat uncomfortable, which feels better with scrotal elevation.  He reports associated increased urinary frequency and some mild intermittent discomfort with urination.  He states his male sexual partner recently was tested positive for trichomonas.  He is monogamous without protection.  He denies associated fever, abdominal pain, nausea, vomiting, rashes or lesions.     The history is provided by the patient and medical records.    Past Medical History:  Diagnosis Date  . Heart murmur    "when I was young" (11/30/2016)    Patient Active Problem List   Diagnosis Date Noted  . Reducible right inguinal hernia 11/27/2016    Past Surgical History:  Procedure Laterality Date  . EXPLORATORY LAPAROTOMY  11/27/2016  . INGUINAL HERNIA REPAIR Right 11/27/2016  . INGUINAL HERNIA REPAIR Right 11/27/2016   Procedure: RIGHT INGUINAL HERNIA REPAIR;  Surgeon: Andria Meuse, MD;  Location: [facility] OR;  Service: General;  Laterality: Right;  . INSERTION OF MESH Right 11/27/2016   Procedure: INSERTION OF MESH;  Surgeon: Andria Meuse, MD;  Location: [facility] OR;  Service: General;  Laterality: Right;  . LAPAROSCOPY N/A 11/27/2016   Procedure: LAPAROSCOPY DIAGNOSTIC;  Surgeon: Andria Meuse, MD;  Location: [facility] OR;  Service: General;  Laterality: N/A;  . LAPAROTOMY N/A 11/27/2016   Procedure: EXPLORATORY LAPAROTOMY;  Surgeon: Andria Meuse, MD;   Location: [facility] OR;  Service: General;  Laterality: N/A;        Home Medications    Prior to Admission medications   Medication Sig Start Date End Date Taking? Authorizing Provider  acetaminophen (TYLENOL) 325 MG tablet Take 2 tablets (650 mg total) by mouth every 8 (eight) hours as needed. 12/01/16   Adam Phenix, PA-C  docusate sodium (COLACE) 100 MG capsule Take 1 capsule (100 mg total) by mouth 2 (two) times daily as needed for mild constipation. 12/01/16   Adam Phenix, PA-C  metroNIDAZOLE (FLAGYL) 500 MG tablet Take 1 tablet (500 mg total) by mouth 2 (two) times daily. 07/02/18   Georgie Eduardo, [place] N, PA-C  oxyCODONE (OXY IR/ROXICODONE) 5 MG immediate release tablet Take 2 tablets (10 mg total) by mouth every 6 (six) hours as needed for moderate pain (  for moderate pain,  for severe pain). 12/01/16   Adam Phenix, PA-C  polyethylene glycol (MIRALAX / GLYCOLAX) packet Take 17 g by mouth daily. 11/04/16   Fayrene Helper, PA-C    Family History No family history on file.  Social History Social History   Tobacco Use  . Smoking status: Never Smoker  . Smokeless tobacco: Never Used  Substance Use Topics  . Alcohol use: Yes    Alcohol/week: 2.0 standard drinks    Types: 2 Cans of beer per week  . Drug use: Yes    Types: Marijuana    Comment: 11/30/2016 "last used last week"     Allergies   Penicillins   Review of Systems Review  of Systems  Constitutional: Negative for fever.  Gastrointestinal: Negative for abdominal pain, nausea and vomiting.  Genitourinary: Positive for dysuria, frequency, scrotal swelling and testicular pain. Negative for discharge, penile pain and penile swelling.  Musculoskeletal: Negative for back pain.  Allergic/Immunologic: Negative for immunocompromised state.  All other systems reviewed and are negative.    Physical Exam Updated Vital Signs BP (!) 143/81   Pulse 64   Temp 98.1 F (36.7 C)   Resp 18   Wt 81.6 kg    SpO2 98%   BMI 24.40 kg/m   Physical Exam Vitals signs and nursing note reviewed.  Constitutional:      General: He is not in acute distress.    Appearance: He is well-developed. He is diaphoretic.  HENT:     Head: Normocephalic and atraumatic.  Eyes:     Conjunctiva/sclera: Conjunctivae normal.  Cardiovascular:     Rate and Rhythm: Normal rate and regular rhythm.  Pulmonary:     Effort: Pulmonary effort is normal. No respiratory distress.     Breath sounds: Normal breath sounds.  Abdominal:     General: A surgical scar is present. Bowel sounds are normal. There is no distension.     Palpations: Abdomen is soft.     Tenderness: There is no abdominal tenderness. There is no guarding.  Genitourinary:    Comments: Exam performed with RN chaperone present.  Right scrotum/testicle with large swelling/mass.  No significant erythema to the scrotum.  The left testicle feels normal and without tenderness.  The right testicle is nontender, the swelling appears to be anterior to the testicle, unable to discern if the swelling/mass is separate from the testicle.  The swelling/mass feels to be uniform.  No bag of worms.  The right spermatic cord feels enlarged and firm, however without tenderness.  No obvious inguinal hernia.  Penis is circumcised, no swelling or tenderness, no discharge.  No rashes or lesions. Skin:    General: Skin is warm.  Neurological:     Mental Status: He is alert.  Psychiatric:        Behavior: Behavior normal.      ED Treatments / Results  Labs (all labs ordered are listed, but only abnormal results are displayed) Labs Reviewed  URINALYSIS, ROUTINE W REFLEX MICROSCOPIC  HIV ANTIBODY (ROUTINE TESTING W REFLEX)  RPR  GC/CHLAMYDIA PROBE AMP ([place]) NOT AT [facility]    EKG None  Radiology US Scrotum W/doppler  Result Date: 07/02/2018 CLINICAL DATA:  Right swelling x2 days, previous right inguinal hernia repair EXAM: SCROTAL ULTRASOUND DOPPLER ULTRASOUND OF  THE TESTICLES TECHNIQUE: Complete ultrasound examination of the testicles, epididymis, and other scrotal structures was performed. Color and spectral Doppler ultrasound were also utilized to evaluate blood flow to the testicles. COMPARISON:  09/08/2015 FINDINGS: Right testicle Measurements: 4.5 x 3 x 3.3 cm. No mass or microlithiasis visualized. Left testicle Measurements: 4.3 x 2.8 x 3.2 cm. No mass or microlithiasis visualized. Right epididymis:  Normal in size and appearance. Left epididymis:  Normal in size and appearance. Hydrocele: Moderate right, smaller left with low level internal echoes. Varicocele: Small,  bilateral Pulsed Doppler interrogation of both testes demonstrates normal low resistance arterial and venous waveforms bilaterally. IMPRESSION: 1. Normal testes.  No mass or torsion. 2. Bilateral  hydroceles, right greater than left. 3. Small bilateral varicoceles. Electronically Signed   By: Corlis Leak M.D.   On: 07/02/2018 11:01    Procedures Procedures (including critical care time)  Medications Ordered in  ED Medications - No data to display   Initial Impression / Assessment and Plan / ED Course  I have reviewed the triage vital signs and the nursing notes.  Pertinent labs & imaging results that were available during my care of the patient were reviewed by me and considered in my medical decision making (see chart for details).       Right testicle/scrotum with large swelling, suspicious for possible hydrocele versus infectious pathology given patient's sexual partner recently tested positive for trichomonas.  Differential diagnosis also includes inguinal hernia, epididymitis, spermatocele.  Some urinary symptoms are present.  No abdominal complaints or fever.  No pain with defecation.  Will send STD cultures, urine, and obtain scrotal ultrasound.  Urine is negative for infection.  Cultures pending.  Ultrasound with bilateral hydroceles, right larger than the left.  Also bilateral  small varicoceles.  Testes and epididymis are normal-appearing.  Will treat for trichomonas as patient has positive exposure.  Discussed symptomatic management, including scrotal support.  Urology referral provided for follow-up.  Strict return precautions discussed.  Safe for discharge.  Final Clinical Impressions(s) / ED Diagnoses   Final diagnoses:  Scrotal swelling  Hydrocele in adult  Exposure to trichomonas    ED Discharge Orders         Ordered    metroNIDAZOLE (FLAGYL) 500 MG tablet  2 times daily     07/02/18 1157           Leanny Moeckel, [place] N, [place] JerseyPA-C 07/02/18 1238    Little, Ambrose Finlandachel Morgan, MD 07/02/18 1413

## 2018-07-02 NOTE — ED Triage Notes (Signed)
PT reports his partner tested positive for STD. And groin pain

## 2018-07-02 NOTE — ED Notes (Signed)
Patient verbalizes understanding of discharge instructions . Opportunity for questions and answers were provided . Armband removed by staff ,Pt discharged from ED. W/C  offered at D/C  and Declined W/C at D/C and was escorted to lobby by RN.  

## 2018-07-02 NOTE — ED Triage Notes (Signed)
Pt in w/R groin pain x 2 days. Hx of R inguinal hernia repair in past. Denies any protrusion/swelling, states pain worse when straining to lift. Also c/o some trouble urinating

## 2018-07-02 NOTE — ED Notes (Signed)
Stuck patient in the left ac for labs patient is now resting with call bell in reach

## 2018-07-02 NOTE — Discharge Instructions (Addendum)
You can take over-the-counter Tylenol or ibuprofen as needed for your discomfort.  Scrotal elevation and support will help your symptoms. Take the antibiotic as prescribed until gone.  Avoid any sexual activity until you know all of your test results.  Is important that you inform all partners of any positive results.  It is recommended to use protection when sexually active. Schedule an appointment with the urologist for further management of your diagnosis today.

## 2018-07-02 NOTE — ED Notes (Signed)
Patient transported to US 

## 2018-07-05 LAB — GC/CHLAMYDIA PROBE AMP (~~LOC~~) NOT AT ARMC
Chlamydia: NEGATIVE
Neisseria Gonorrhea: NEGATIVE

## 2018-07-07 LAB — HIV ANTIBODY (ROUTINE TESTING W REFLEX): HIV Screen 4th Generation wRfx: NONREACTIVE

## 2018-07-12 LAB — RPR, QUANT+TP ABS (REFLEX)
Rapid Plasma Reagin, Quant: 1:1 {titer} — ABNORMAL HIGH
T Pallidum Abs: REACTIVE — AB

## 2018-07-12 LAB — RPR: RPR Ser Ql: REACTIVE — AB

## 2018-12-22 ENCOUNTER — Emergency Department (HOSPITAL_COMMUNITY)
Admission: EM | Admit: 2018-12-22 | Discharge: 2018-12-22 | Disposition: A | Discharge status: Home / Self Care | Payer: Self-pay | Attending: Emergency Medicine | Admitting: Emergency Medicine

## 2018-12-22 ENCOUNTER — Emergency Department (HOSPITAL_COMMUNITY): Payer: Self-pay

## 2018-12-22 ENCOUNTER — Encounter (HOSPITAL_COMMUNITY): Payer: Self-pay | Admitting: Emergency Medicine

## 2018-12-22 DIAGNOSIS — I4891 Unspecified atrial fibrillation: Secondary | ICD-10-CM | POA: Insufficient documentation

## 2018-12-22 DIAGNOSIS — Z79899 Other long term (current) drug therapy: Secondary | ICD-10-CM | POA: Insufficient documentation

## 2018-12-22 LAB — CBC
HCT: 46.5 % (ref 39.0–52.0)
Hemoglobin: 15.1 g/dL (ref 13.0–17.0)
MCH: 33.4 pg (ref 26.0–34.0)
MCHC: 32.5 g/dL (ref 30.0–36.0)
MCV: 102.9 fL — ABNORMAL HIGH (ref 80.0–100.0)
Platelets: 218 10*3/uL (ref 150–400)
RBC: 4.52 MIL/uL (ref 4.22–5.81)
RDW: 11.8 % (ref 11.5–15.5)
WBC: 5.2 10*3/uL (ref 4.0–10.5)
nRBC: 0 % (ref 0.0–0.2)

## 2018-12-22 LAB — BASIC METABOLIC PANEL
Anion gap: 11 (ref 5–15)
BUN: 11 mg/dL (ref 6–20)
CO2: 26 mmol/L (ref 22–32)
Calcium: 9.2 mg/dL (ref 8.9–10.3)
Chloride: 102 mmol/L (ref 98–111)
Creatinine, Ser: 0.95 mg/dL (ref 0.61–1.24)
GFR calc Af Amer: >60 mL/min (ref >60)
GFR calc non Af Amer: >60 mL/min (ref >60)
Glucose, Bld: 98 mg/dL (ref 70–99)
Potassium: 4.4 mmol/L (ref 3.5–5.1)
Sodium: 139 mmol/L (ref 135–145)

## 2018-12-22 LAB — T4, FREE: Free T4: 0.91 ng/dL (ref 0.61–1.12)

## 2018-12-22 LAB — TSH: TSH: 0.849 u[IU]/mL (ref 0.350–4.500)

## 2018-12-22 MED ORDER — METOPROLOL TARTRATE 25 MG PO TABS
25.0000 mg | ORAL_TABLET | Freq: Once | ORAL | Status: AC
  Administered 2018-12-22: 10:00:00 25 mg via ORAL
  Filled 2018-12-22: qty 1

## 2018-12-22 MED ORDER — METOPROLOL TARTRATE 25 MG PO TABS: 12.5000 mg | ORAL_TABLET | Freq: Two times a day (BID) | ORAL | 0 refills | Status: DC

## 2018-12-22 MED ORDER — SODIUM CHLORIDE 0.9% FLUSH: 3.0000 mL | Freq: Once | INTRAVENOUS | Status: DC

## 2018-12-22 NOTE — ED Provider Notes (Signed)
[facility] EMERGENCY DEPARTMENT Provider Note   CSN: [id number] Arrival date & time: 12/22/18  6962     History   Chief Complaint Chief Complaint  Patient presents with  . Atrial Fibrillation    HPI Keith Romero is a 53 y.o. male.     HPI   Pt is a 53 y/o male with a h/o murmur who presents to the ED today for evaluation of fast heart beat that started yesterday. States that yesterday after work he noticed palpitations and felt like his heart was beating fast. This sxs subsequently resolved but this morning sxs recurred when he was walking up the stairs. Denies any chest pain or shortness of breath. Denies fevers, pleuritic pain, BLE swelling.   States he had similar sxs once before about 3 years about but states that no diagnosis was found at that time.  Denies leg pain/swelling, hemoptysis, recent surgery/trauma, recent long travel, hormone use, personal hx of cancer, or hx of DVT/PE.   Past Medical History:  Diagnosis Date  . Heart murmur    "when I was young" (11/30/2016)    Patient Active Problem List   Diagnosis Date Noted  . Reducible right inguinal hernia 11/27/2016    Past Surgical History:  Procedure Laterality Date  . EXPLORATORY LAPAROTOMY  11/27/2016  . INGUINAL HERNIA REPAIR Right 11/27/2016  . INGUINAL HERNIA REPAIR Right 11/27/2016   Procedure: RIGHT INGUINAL HERNIA REPAIR;  Surgeon: Ileana Roup, MD;  Location: [place];  Service: General;  Laterality: Right;  . INSERTION OF MESH Right 11/27/2016   Procedure: INSERTION OF MESH;  Surgeon: Ileana Roup, MD;  Location: [place];  Service: General;  Laterality: Right;  . LAPAROSCOPY N/A 11/27/2016   Procedure: LAPAROSCOPY DIAGNOSTIC;  Surgeon: Ileana Roup, MD;  Location: [facility];  Service: General;  Laterality: N/A;  . LAPAROTOMY N/A 11/27/2016   Procedure: EXPLORATORY LAPAROTOMY;  Surgeon: Ileana Roup, MD;  Location: [facility];  Service: General;  Laterality:  N/A;        Home Medications    Prior to Admission medications   Medication Sig Start Date End Date Taking? Authorizing Provider  acetaminophen (TYLENOL) 325 MG tablet Take 2 tablets (650 mg total) by mouth every 8 (eight) hours as needed. 12/01/16   Jill Alexanders, PA-C  docusate sodium (COLACE) 100 MG capsule Take 1 capsule (100 mg total) by mouth 2 (two) times daily as needed for mild constipation. 12/01/16   Jill Alexanders, PA-C  metoprolol tartrate (LOPRESSOR) 25 MG tablet Take 0.5 tablets (12.5 mg total) by mouth 2 (two) times daily. 12/22/18 01/21/19  Adelyna Brockman S, PA-C  metroNIDAZOLE (FLAGYL) 500 MG tablet Take 1 tablet (500 mg total) by mouth 2 (two) times daily. 07/02/18   Robinson, [place] N, PA-C  oxyCODONE (OXY IR/ROXICODONE) 5 MG immediate release tablet Take 2 tablets (10 mg total) by mouth every 6 (six) hours as needed for moderate pain (5mg  for moderate pain, 10mg  for severe pain). 12/01/16   Jill Alexanders, PA-C  polyethylene glycol (MIRALAX / GLYCOLAX) packet Take 17 g by mouth daily. 11/04/16   Domenic Moras, PA-C    Family History No family history on file.  Social History Social History   Tobacco Use  . Smoking status: Never Smoker  . Smokeless tobacco: Never Used  Substance Use Topics  . Alcohol use: Yes    Alcohol/week: 2.0 standard drinks    Types: 2 Cans of beer per week  . Drug  use: Yes    Types: Marijuana    Comment: 11/30/2016 "last used last week"     Allergies   Penicillins   Review of Systems Review of Systems  Constitutional: Positive for fatigue. Negative for fever.  HENT: Negative for ear pain and sore throat.   Eyes: Negative for visual disturbance.  Respiratory: Negative for cough and shortness of breath.   Cardiovascular: Positive for palpitations. Negative for chest pain and leg swelling.  Gastrointestinal: Negative for abdominal pain, constipation, diarrhea, nausea and vomiting.  Genitourinary: Negative for  dysuria and hematuria.  Musculoskeletal: Negative for back pain.  Skin: Negative for rash.  Neurological: Negative for headaches.  All other systems reviewed and are negative.    Physical Exam Updated Vital Signs BP 137/86 (BP Location: Left Arm)   Pulse (!) 112   Temp 98.3 F (36.8 C) (Oral)   Resp 14   SpO2 98%   Physical Exam Vitals signs and nursing note reviewed.  Constitutional:      Appearance: He is well-developed.  HENT:     Head: Normocephalic and atraumatic.  Eyes:     Conjunctiva/sclera: Conjunctivae normal.  Neck:     Musculoskeletal: Neck supple.  Cardiovascular:     Rate and Rhythm: Normal rate.     Heart sounds: No murmur.     Comments: Irregularly irregular rhythm Pulmonary:     Effort: Pulmonary effort is normal. No respiratory distress.     Breath sounds: Normal breath sounds. No wheezing, rhonchi or rales.  Abdominal:     General: Bowel sounds are normal.     Palpations: Abdomen is soft.     Tenderness: There is no abdominal tenderness.  Musculoskeletal:     Comments: No calf TTP, erythema, swelling.  Skin:    General: Skin is warm and dry.  Neurological:     Mental Status: He is alert.      ED Treatments / Results  Labs (all labs ordered are listed, but only abnormal results are displayed) Labs Reviewed  CBC - Abnormal; Notable for the following components:      Result Value   MCV 102.9 (*)    All other components within normal limits  BASIC METABOLIC PANEL  TSH  T4, FREE    EKG EKG Interpretation  Date/Time:  Thursday December 22 2018 07:24:34 EST Ventricular Rate:  89 PR Interval:    QRS Duration: 80 QT Interval:  330 QTC Calculation: 401 R Axis:   46 Text Interpretation: Atrial fibrillation with premature ventricular or aberrantly conducted complexes Abnormal ECG afib appears new compared to 2013 Confirmed by Pricilla Loveless [phone]) on 12/22/2018 8:04:55 AM   Radiology Dg Chest 2 View  Result Date: 12/22/2018  CLINICAL DATA:  Fatigue with exertion. EXAM: CHEST - 2 VIEW COMPARISON:  None. FINDINGS: The heart size and mediastinal contours are within normal limits. Both lungs are clear. No pneumothorax or pleural effusion is noted. The visualized skeletal structures are unremarkable. IMPRESSION: No active cardiopulmonary disease. Electronically Signed   By: Lupita Raider M.D.   On: 12/22/2018 07:52    Procedures Procedures (including critical care time)  Medications Ordered in ED Medications  sodium chloride flush (NS) 0.9 % injection 3 mL (has no administration in time range)  metoprolol tartrate (LOPRESSOR) tablet 25 mg (has no administration in time range)     Initial Impression / Assessment and Plan / ED Course  I have reviewed the triage vital signs and the nursing notes.  Pertinent labs & imaging  results that were available during my care of the patient were reviewed by me and considered in my medical decision making (see chart for details).   Final Clinical Impressions(s) / ED Diagnoses   Final diagnoses:  Atrial fibrillation, unspecified type (HCC)   53 y/o male presenting for eval of palpiations ('fast heart rate') and fatigue starting yesterday. No prior known medical hx. Denies CP/SOB. Doubt ACS as cause. No PE risk factors.   CBC WNL BMP with normal electrolytes and kidney function.   EKG with Atrial fibrillation with premature ventricular or aberrantly conducted complexes Abnormal ECG afib appears new compared to 2013  CXR without evidence for acute abnormality   This patients CHA2DS2-VASc Score and unadjusted Ischemic Stroke Rate (% per year) is equal to 0.2 % stroke rate/year from a score of 0, therefore anticoagulation is not indicated at this time.   Pt with new onset afib. Rate controlled in the ED. BP stable and pt in no distress. No indication for anticaogulation. Pt seen by supervising physician, Dr. Criss AlvineGoldston who discussed tx options including cardioversion with  patient. Pt opted against cardioversion. He was given metoprolol in the ED. He will be given rx for metoprolol and will give info for f/u in the afib clinic. Advised on return precautions.  ED Discharge Orders         Ordered    Amb Referral to AFIB Clinic     12/22/18 0906    metoprolol tartrate (LOPRESSOR) 25 MG tablet  2 times daily     12/22/18 0939           Karrie MeresCouture, Dillard Pascal S, PA-C 12/22/18 0941    Pricilla LovelessGoldston, Scott, MD 12/22/18 [phone]

## 2018-12-22 NOTE — ED Triage Notes (Signed)
Patient presents ambulatory c/o elevated heart rate yesterday. Patient unsure of what HR was but wife told him it was irregular. Patient reports hx of heart murmur. C/o fatigue and feeling more tired with exertion. Denies any chest pain or shortness of breath.

## 2018-12-22 NOTE — ED Notes (Signed)
Patient verbalizes understanding of discharge instructions. Opportunity for questioning and answers were provided. Armband removed by staff, pt discharged from ED. Ambulated out to lobby  

## 2018-12-22 NOTE — Discharge Instructions (Addendum)
You were given a medication to help with your heart rate and rhythm.  Please take as directed.  You are also given information to follow-up with the atrial fibrillation clinic.  Please call the office today to schedule an appointment for follow-up. You currently have some laboratory work pending including checking your thyroid levels. You will need to have these labs followed up in the afib clinic.   Please return to the emergency department for any new or worsening symptoms including any chest pain, shortness of breath, increased fatigue or persistent palpitations.

## 2018-12-26 ENCOUNTER — Ambulatory Visit (HOSPITAL_COMMUNITY)
Admission: RE | Admit: 2018-12-26 | Discharge: 2018-12-26 | Disposition: A | Discharge status: Home / Self Care | Payer: Self-pay | Source: Ambulatory Visit | Attending: Physician Assistant | Admitting: Physician Assistant

## 2018-12-26 ENCOUNTER — Encounter (HOSPITAL_COMMUNITY): Payer: Self-pay | Admitting: Physician Assistant

## 2018-12-26 ENCOUNTER — Other Ambulatory Visit: Payer: Self-pay

## 2018-12-26 VITALS — BP 134/76 | HR 68 | Ht 72.0 in | Wt 166.8 lb

## 2018-12-26 DIAGNOSIS — I480 Paroxysmal atrial fibrillation: Secondary | ICD-10-CM

## 2018-12-26 DIAGNOSIS — I4891 Unspecified atrial fibrillation: Secondary | ICD-10-CM | POA: Insufficient documentation

## 2018-12-26 DIAGNOSIS — Z79899 Other long term (current) drug therapy: Secondary | ICD-10-CM | POA: Insufficient documentation

## 2018-12-26 DIAGNOSIS — I517 Cardiomegaly: Secondary | ICD-10-CM | POA: Insufficient documentation

## 2018-12-26 DIAGNOSIS — Z880 Allergy status to penicillin: Secondary | ICD-10-CM | POA: Insufficient documentation

## 2018-12-26 MED ORDER — METOPROLOL TARTRATE 25 MG PO TABS: 12.5000 mg | ORAL_TABLET | Freq: Two times a day (BID) | ORAL | 3 refills | Status: DC

## 2018-12-26 NOTE — Progress Notes (Signed)
Primary Care Physician: Patient, No Pcp Per Primary Cardiologist: none Primary Electrophysiologist: none Referring Physician: [facility]   Keith Romero is a 53 y.o. male with a history of an inguinal hernia s/p repear and new onset atrial fibrillation who presents for consultation in the [facility].  The patient was initially diagnosed with atrial fibrillation on 12/22/18 after presenting to the ER with symptoms of heart racing which had started one day prior. He was found to be in rate controlled afib. Patient reports he had symptoms similar to this about 3 years ago. He was started on metoprolol at the ER. He denies any specific triggers. He denies significant snoring or alcohol use. He does report that he missed a dose of metoprolol and had an episode of heart racing which was brief. He admits he is anxious about his heart.  Today, he denies symptoms of chest pain, shortness of breath, orthopnea, PND, lower extremity edema, dizziness, presyncope, syncope, snoring, daytime somnolence, bleeding, or neurologic sequela. The patient is tolerating medications without difficulties and is otherwise without complaint today.    Atrial Fibrillation Risk Factors:  he does not have symptoms or diagnosis of sleep apnea. he does not have a history of rheumatic fever. he does not have a history of alcohol use. The patient does not have a history of early familial atrial fibrillation or other arrhythmias.  he has a BMI of Body mass index is 22.62 kg/m.Marland Kitchen Filed Weights   12/26/18 1502  Weight: 75.7 kg    No family history on file.   Atrial Fibrillation Management history:  Previous antiarrhythmic drugs: none Previous cardioversions: none Previous ablations: none CHADS2VASC score: 0 Anticoagulation history: none   Past Medical History:  Diagnosis Date  . Heart murmur    "when I was young" (11/30/2016)   Past Surgical History:  Procedure Laterality Date   . EXPLORATORY LAPAROTOMY  11/27/2016  . INGUINAL HERNIA REPAIR Right 11/27/2016  . INGUINAL HERNIA REPAIR Right 11/27/2016   Procedure: RIGHT INGUINAL HERNIA REPAIR;  Surgeon: Andria Meuse, MD;  Location: [facility] OR;  Service: General;  Laterality: Right;  . INSERTION OF MESH Right 11/27/2016   Procedure: INSERTION OF MESH;  Surgeon: Andria Meuse, MD;  Location: [facility] OR;  Service: General;  Laterality: Right;  . LAPAROSCOPY N/A 11/27/2016   Procedure: LAPAROSCOPY DIAGNOSTIC;  Surgeon: Andria Meuse, MD;  Location: [facility] OR;  Service: General;  Laterality: N/A;  . LAPAROTOMY N/A 11/27/2016   Procedure: EXPLORATORY LAPAROTOMY;  Surgeon: Andria Meuse, MD;  Location: [facility] OR;  Service: General;  Laterality: N/A;    Current Outpatient Medications  Medication Sig Dispense Refill  . metoprolol tartrate (LOPRESSOR) 25 MG tablet Take 0.5 tablets (12.5 mg total) by mouth 2 (two) times daily. 30 tablet 3   No current facility-administered medications for this encounter.     Allergies  Allergen Reactions  . Penicillins Hives    Social History   Socioeconomic History  . Marital status: Single    Spouse name: Not on file  . Number of children: Not on file  . Years of education: Not on file  . Highest education level: Not on file  Occupational History  . Not on file  Social Needs  . Financial resource strain: Not on file  . Food insecurity    Worry: Not on file    Inability: Not on file  . Transportation needs    Medical: Not on file    Non-medical:  Not on file  Tobacco Use  . Smoking status: Never Smoker  . Smokeless tobacco: Never Used  Substance and Sexual Activity  . Alcohol use: Not Currently    Alcohol/week: 2.0 standard drinks    Types: 2 Cans of beer per week  . Drug use: Not Currently    Types: Marijuana    Comment: 11/30/2016 "last used last week"  . Sexual activity: Yes  Lifestyle  . Physical activity    Days per week: Not on file    Minutes per  session: Not on file  . Stress: Not on file  Relationships  . Social Herbalist on phone: Not on file    Gets together: Not on file    Attends religious service: Not on file    Active member of club or organization: Not on file    Attends meetings of clubs or organizations: Not on file    Relationship status: Not on file  . Intimate partner violence    Fear of current or ex partner: Not on file    Emotionally abused: Not on file    Physically abused: Not on file    Forced sexual activity: Not on file  Other Topics Concern  . Not on file  Social History Narrative  . Not on file     ROS- All systems are reviewed and negative except as per the HPI above.  Physical Exam: Vitals:   12/26/18 1502  BP: 134/76  Pulse: 68  Weight: 75.7 kg  Height: 6' (1.829 m)    GEN- The patient is well appearing, alert and oriented x 3 today.   Head- normocephalic, atraumatic Eyes-  Sclera clear, conjunctiva pink Ears- hearing intact Oropharynx- clear Neck- supple  Lungs- Clear to ausculation bilaterally, normal work of breathing Heart- Regular rate and rhythm, no murmurs, rubs or gallops  GI- soft, NT, ND, + BS Extremities- no clubbing, cyanosis, or edema MS- no significant deformity or atrophy Skin- no rash or lesion Psych- euthymic mood, full affect Neuro- strength and sensation are intact  Wt Readings from Last 3 Encounters:  12/26/18 75.7 kg  07/02/18 81.6 kg  12/10/16 81.6 kg    EKG today demonstrates SR HR 68, LVH, PR 172, QRS 92, QTc 425  Epic records are reviewed at length today  Assessment and Plan:  1. New onset atrial fibrillation General education about afib provided and questions answered. We also discussed his stroke risk and the risks and benefits of anticoagulation.  Patient has had more episodes of heart racing corresponding with his anxiety. Will order 7 day Zio patch to see if this also correlates with his arrhythmia. Check echocardiogram  Continue Lopressor 12.5 mg BID No anticoagulation indicated at this time.  This patients CHA2DS2-VASc Score and unadjusted Ischemic Stroke Rate (% per year) is equal to 0.2 % stroke rate/year from a score of 0  Above score calculated as 1 point each if present [CHF, HTN, DM, Vascular=MI/PAD/Aortic Plaque, Age if 65-74, or Male] Above score calculated as 2 points each if present [Age > 75, or Stroke/TIA/TE]   Follow up in the AF clinic in one month.   [facility] [address] [place], [place] [address] [phone] 12/26/2018 3:38 PM

## 2019-01-13 ENCOUNTER — Ambulatory Visit (HOSPITAL_COMMUNITY): Admission: RE | Admit: 2019-01-13 | Payer: Self-pay | Source: Ambulatory Visit

## 2019-01-16 ENCOUNTER — Ambulatory Visit (HOSPITAL_COMMUNITY): Admission: RE | Admit: 2019-01-16 | Payer: Self-pay | Source: Ambulatory Visit

## 2019-01-19 ENCOUNTER — Other Ambulatory Visit (HOSPITAL_COMMUNITY): Payer: Self-pay | Admitting: *Deleted

## 2019-01-19 MED ORDER — METOPROLOL TARTRATE 25 MG PO TABS: 12.5000 mg | ORAL_TABLET | Freq: Two times a day (BID) | ORAL | 3 refills | Status: DC

## 2019-01-20 ENCOUNTER — Other Ambulatory Visit: Payer: Self-pay

## 2019-01-20 ENCOUNTER — Ambulatory Visit (HOSPITAL_COMMUNITY)
Admission: RE | Admit: 2019-01-20 | Discharge: 2019-01-20 | Disposition: A | Discharge status: Home / Self Care | Payer: Self-pay | Source: Ambulatory Visit | Attending: Physician Assistant | Admitting: Physician Assistant

## 2019-01-20 DIAGNOSIS — R9431 Abnormal electrocardiogram [ECG] [EKG]: Secondary | ICD-10-CM | POA: Insufficient documentation

## 2019-01-20 DIAGNOSIS — I480 Paroxysmal atrial fibrillation: Secondary | ICD-10-CM

## 2019-01-20 NOTE — Progress Notes (Signed)
  Echocardiogram 2D Echocardiogram has been performed.  Keith Romero 01/20/2019, 2:50 PM

## 2019-01-22 ENCOUNTER — Encounter (HOSPITAL_COMMUNITY): Payer: Self-pay | Admitting: Emergency Medicine

## 2019-01-22 ENCOUNTER — Other Ambulatory Visit: Payer: Self-pay

## 2019-01-22 ENCOUNTER — Emergency Department (HOSPITAL_COMMUNITY)
Admission: EM | Admit: 2019-01-22 | Discharge: 2019-01-22 | Disposition: A | Discharge status: Home / Self Care | Payer: Self-pay | Attending: Emergency Medicine | Admitting: Emergency Medicine

## 2019-01-22 DIAGNOSIS — R300 Dysuria: Secondary | ICD-10-CM | POA: Insufficient documentation

## 2019-01-22 DIAGNOSIS — Z202 Contact with and (suspected) exposure to infections with a predominantly sexual mode of transmission: Secondary | ICD-10-CM | POA: Insufficient documentation

## 2019-01-22 LAB — URINALYSIS, ROUTINE W REFLEX MICROSCOPIC
Bilirubin Urine: NEGATIVE
Glucose, UA: NEGATIVE mg/dL
Hgb urine dipstick: NEGATIVE
Ketones, ur: NEGATIVE mg/dL
Leukocytes,Ua: NEGATIVE
Nitrite: NEGATIVE
Protein, ur: NEGATIVE mg/dL
Specific Gravity, Urine: 1.013 (ref 1.005–1.030)
pH: 6 (ref 5.0–8.0)

## 2019-01-22 MED ORDER — AZITHROMYCIN 250 MG PO TABS
1000.0000 mg | ORAL_TABLET | Freq: Once | ORAL | Status: AC
  Administered 2019-01-22: 1000 mg via ORAL
  Filled 2019-01-22: qty 4

## 2019-01-22 MED ORDER — CEFTRIAXONE SODIUM 250 MG IJ SOLR
250.0000 mg | Freq: Once | INTRAMUSCULAR | Status: AC
  Administered 2019-01-22: 250 mg via INTRAMUSCULAR
  Filled 2019-01-22: qty 250

## 2019-01-22 MED ORDER — LIDOCAINE HCL (PF) 1 % IJ SOLN
INTRAMUSCULAR | Status: AC
  Administered 2019-01-22: 5 mL
  Filled 2019-01-22: qty 5

## 2019-01-22 NOTE — Discharge Instructions (Signed)
You have been screened for potential sexually transmitted infection.  You have received prophylactic antibiotic. If your test comes back positive, you will be notify in the next 3-5 days. Follow instruction below.

## 2019-01-22 NOTE — ED Triage Notes (Signed)
C/o burning with urination x 1 week.

## 2019-01-22 NOTE — ED Provider Notes (Signed)
[facility] EMERGENCY DEPARTMENT Provider Note   CSN: [id number] Arrival date & time: 01/22/19  8295     History Chief Complaint  Patient presents with  . burning with urination    Keith Romero is a 53 y.o. male.  The history is provided by the patient. No language interpreter was used.       53 year old male presents for evaluation of dysuria.  Patient report his sexual partner recently told him that she tested positive for trichomonas.  Patient states that for the past several days he has noticed some stinging sensation when urinating without penile discharge or hematuria.  He did report remote history of trichomonas.  Does not complain of any fever abdominal pain or back pain.  He has had sexual encounter with the same partner for more than 6 months, not using protection.  History of right inguinal hernia with inguinal repair.  Denies any scrotal pain.   Past Medical History:  Diagnosis Date  . Heart murmur    "when I was young" (11/30/2016)    Patient Active Problem List   Diagnosis Date Noted  . Paroxysmal atrial fibrillation (HCC) 12/26/2018  . Reducible right inguinal hernia 11/27/2016    Past Surgical History:  Procedure Laterality Date  . EXPLORATORY LAPAROTOMY  11/27/2016  . INGUINAL HERNIA REPAIR Right 11/27/2016  . INGUINAL HERNIA REPAIR Right 11/27/2016   Procedure: RIGHT INGUINAL HERNIA REPAIR;  Surgeon: Andria Meuse, MD;  Location: [facility] OR;  Service: General;  Laterality: Right;  . INSERTION OF MESH Right 11/27/2016   Procedure: INSERTION OF MESH;  Surgeon: Andria Meuse, MD;  Location: [facility] OR;  Service: General;  Laterality: Right;  . LAPAROSCOPY N/A 11/27/2016   Procedure: LAPAROSCOPY DIAGNOSTIC;  Surgeon: Andria Meuse, MD;  Location: [facility] OR;  Service: General;  Laterality: N/A;  . LAPAROTOMY N/A 11/27/2016   Procedure: EXPLORATORY LAPAROTOMY;  Surgeon: Andria Meuse, MD;  Location: [facility] OR;  Service: General;   Laterality: N/A;       No family history on file.  Social History   Tobacco Use  . Smoking status: Never Smoker  . Smokeless tobacco: Never Used  Substance Use Topics  . Alcohol use: Not Currently    Alcohol/week: 2.0 standard drinks    Types: 2 Cans of beer per week  . Drug use: Not Currently    Types: Marijuana    Comment: 11/30/2016 "last used last week"    Home Medications Prior to Admission medications   Medication Sig Start Date End Date Taking? Authorizing Provider  metoprolol tartrate (LOPRESSOR) 25 MG tablet Take 0.5 tablets (12.5 mg total) by mouth 2 (two) times daily. 01/19/19 02/18/19  Fenton, Clint R, PA    Allergies    Penicillins  Review of Systems   Review of Systems  Constitutional: Negative for fever.  Genitourinary: Positive for dysuria. Negative for discharge, genital sores, hematuria, penile pain and testicular pain.  Skin: Negative for rash.    Physical Exam Updated Vital Signs BP 122/83 (BP Location: Left Arm)   Pulse 60   Temp 98.1 F (36.7 C) (Oral)   Resp 16   SpO2 100%   Physical Exam Vitals and nursing note reviewed.  Constitutional:      General: He is not in acute distress.    Appearance: He is well-developed.  HENT:     Head: Atraumatic.  Eyes:     Conjunctiva/sclera: Conjunctivae normal.  Genitourinary:    Comments: Chaperone present during exam.  Circumcised  penis free of lesion or rash.  No penile discharge.  No inguinal lymphadenopathy or inguinal hernia noted.  Scrotal is edematous but nontender and not erythematous.  Scrotal edema secondary to previous right inguinal hernia status post repair.  Testicles nontender. Musculoskeletal:     Cervical back: Neck supple.  Skin:    Findings: No rash.  Neurological:     Mental Status: He is alert.     ED Results / Procedures / Treatments   Labs (all labs ordered are listed, but only abnormal results are displayed) Labs Reviewed  URINALYSIS, ROUTINE W REFLEX MICROSCOPIC    RPR  HIV ANTIBODY (ROUTINE TESTING W REFLEX)  GC/CHLAMYDIA PROBE AMP ([place]) NOT AT [facility]    EKG None  Radiology ECHOCARDIOGRAM COMPLETE  Result Date: 01/20/2019   ECHOCARDIOGRAM REPORT   Patient Name:   FEDERICO MAIORINO Date of Exam: 01/20/2019 Medical Rec #:  [id number]     Height:       72.0 in Accession #:    [id number]    Weight:       166.8 lb Date of Birth:  01/20/1966     BSA:          1.97 m Patient Age:    53 years      BP:           124/75 mmHg Patient Gender: M             HR:           57 bpm. Exam Location:  Outpatient Procedure: 2D Echo, Cardiac Doppler, Color Doppler and Strain Analysis Indications:    I48.91* Unspeicified atrial fibrillation  History:        Patient has no prior history of Echocardiogram examinations.                 Abnormal ECG; Arrythmias:Atrial Fibrillation.  Sonographer:    Sheralyn Boatman Referring Phys: [id number] CLINT R FENTON IMPRESSIONS  1. Left ventricular ejection fraction, by visual estimation, is 60 to 65%. The left ventricle has normal function. There is mildly increased left ventricular hypertrophy.  2. The left ventricle has no regional wall motion abnormalities.  3. Global right ventricle has normal systolic function.The right ventricular size is normal. No increase in right ventricular wall thickness.  4. Left atrial size was normal.  5. Right atrial size was normal.  6. The mitral valve is normal in structure. Trivial mitral valve regurgitation. No evidence of mitral stenosis.  7. The tricuspid valve is normal in structure. Tricuspid valve regurgitation is trivial.  8. The aortic valve is normal in structure. Aortic valve regurgitation is not visualized. No evidence of aortic valve sclerosis or stenosis.  9. The pulmonic valve was normal in structure. Pulmonic valve regurgitation is trivial. 10. Mildly elevated pulmonary artery systolic pressure. 11. The inferior vena cava is normal in size with greater than 50% respiratory variability, suggesting right  atrial pressure of 3 mmHg. 12. The average left ventricular global longitudinal strain is -20.7 %. FINDINGS  Left Ventricle: Left ventricular ejection fraction, by visual estimation, is 60 to 65%. The left ventricle has normal function. The average left ventricular global longitudinal strain is -20.7 %. The left ventricle has no regional wall motion abnormalities. There is mildly increased left ventricular hypertrophy. Normal left atrial pressure. Right Ventricle: The right ventricular size is normal. No increase in right ventricular wall thickness. Global RV systolic function is has normal systolic function. The tricuspid regurgitant velocity is 2.66 m/s, and with  an assumed right atrial pressure  of 3 mmHg, the estimated right ventricular systolic pressure is mildly elevated at 31.3 mmHg. Left Atrium: Left atrial size was normal in size. Right Atrium: Right atrial size was normal in size Pericardium: There is no evidence of pericardial effusion. Mitral Valve: The mitral valve is normal in structure. Trivial mitral valve regurgitation. No evidence of mitral valve stenosis by observation. Tricuspid Valve: The tricuspid valve is normal in structure. Tricuspid valve regurgitation is trivial. Aortic Valve: The aortic valve is normal in structure. Aortic valve regurgitation is not visualized. The aortic valve is structurally normal, with no evidence of sclerosis or stenosis. Pulmonic Valve: The pulmonic valve was normal in structure. Pulmonic valve regurgitation is trivial. Pulmonic regurgitation is trivial. Aorta: The aortic root, ascending aorta and aortic arch are all structurally normal, with no evidence of dilitation or obstruction. Venous: The inferior vena cava is normal in size with greater than 50% respiratory variability, suggesting right atrial pressure of 3 mmHg. IAS/Shunts: No atrial level shunt detected by color flow Doppler. There is no evidence of a patent foramen ovale. No ventricular septal defect is  seen or detected. There is no evidence of an atrial septal defect.  LEFT VENTRICLE PLAX 2D LVIDd:         4.90 cm       Diastology LVIDs:         3.00 cm       LV e' lateral:   9.79 cm/s LV PW:         1.50 cm       LV E/e' lateral: 6.5 LV IVS:        1.40 cm       LV e' medial:    8.38 cm/s LVOT diam:     2.20 cm       LV E/e' medial:  7.6 LV SV:         78 ml LV SV Index:   39.71         2D Longitudinal Strain LVOT Area:     3.80 cm      2D Strain GLS Avg:     -20.7 %  LV Volumes (MOD) LV area d, A2C:    33.40 cm LV area d, A4C:    34.00 cm LV area s, A2C:    18.80 cm LV area s, A4C:    20.50 cm LV major d, A2C:   9.16 cm LV major d, A4C:   7.97 cm LV major s, A2C:   7.37 cm LV major s, A4C:   7.06 cm LV vol d, MOD A2C: 102.0 ml LV vol d, MOD A4C: 121.0 ml LV vol s, MOD A2C: 40.8 ml LV vol s, MOD A4C: 48.4 ml LV SV MOD A2C:     61.2 ml LV SV MOD A4C:     121.0 ml LV SV MOD BP:      73.5 ml RIGHT VENTRICLE             IVC RV S prime:     10.80 cm/s  IVC diam: 1.30 cm TAPSE (M-mode): 2.1 cm LEFT ATRIUM             Index       RIGHT ATRIUM           Index LA diam:        2.90 cm 1.47 cm/m  RA Area:     14.80 cm LA Vol (A2C):   32.7 ml 16.58  ml/m RA Volume:   34.30 ml  17.39 ml/m LA Vol (A4C):   21.7 ml 11.00 ml/m LA Biplane Vol: 28.5 ml 14.45 ml/m  AORTIC VALVE             PULMONIC VALVE LVOT Vmax:   131.00 cm/s PR End Diast Vel: 1.34 msec LVOT Vmean:  78.300 cm/s LVOT VTI:    0.251 m  AORTA Ao Root diam: 4.00 cm Ao Asc diam:  3.40 cm MITRAL VALVE                        TRICUSPID VALVE MV Area (PHT): 2.99 cm             TR Peak grad:   28.3 mmHg MV PHT:        73.66 msec           TR Vmax:        266.00 cm/s MV Decel Time: 254 msec MV E velocity: 63.30 cm/s 103 cm/s  SHUNTS MV A velocity: 69.40 cm/s 70.3 cm/s Systemic VTI:  0.25 m MV E/A ratio:  0.91       1.5       Systemic Diam: 2.20 cm  Candee Furbish MD Electronically signed by Candee Furbish MD Signature Date/Time: 01/20/2019/3:03:25 PM    Final      Procedures Procedures (including critical care time)  Medications Ordered in ED Medications  cefTRIAXone (ROCEPHIN) injection 250 mg (has no administration in time range)  azithromycin (ZITHROMAX) tablet 1,000 mg (has no administration in time range)    ED Course  I have reviewed the triage vital signs and the nursing notes.  Pertinent labs & imaging results that were available during my care of the patient were reviewed by me and considered in my medical decision making (see chart for details).    MDM Rules/Calculators/A&P     CHA2DS2/VAS Stroke Risk Points  Current as of 41 minutes ago     0 >= 2 Points: High Risk  1 - 1.99 Points: Medium Risk  0 Points: Low Risk    The patient's score has not changed in the past year.: No Change     Details    This score determines the patient's risk of having a stroke if the  patient has atrial fibrillation.       Points Metrics  0 Has Congestive Heart Failure:  No    Current as of 41 minutes ago  0 Has Vascular Disease:  No    Current as of 41 minutes ago  0 Has Hypertension:  No    Current as of 41 minutes ago  0 Age:  55    Current as of 41 minutes ago  0 Has Diabetes:  No    Current as of 41 minutes ago  0 Had Stroke:  No  Had TIA:  No  Had thromboembolism:  No    Current as of 41 minutes ago  0 Male:  No    Current as of 41 minutes ago                         BP 122/83 (BP Location: Left Arm)   Pulse 60   Temp 98.1 F (36.7 C) (Oral)   Resp 16   SpO2 100%   Final Clinical Impression(s) / ED Diagnoses Final diagnoses:  Sexually transmitted disease exposure    Rx / DC Orders ED Discharge Orders  None     10:41 AM Patient report urinary discomfort for the past few days, sexual partner recently diagnosed with trichomonas.  UA today showed no acute finding to suggest UTI or obvious trichomonas.  Patient given Rocephin and Zithromax prophylactically.  STI screening sent.   Fayrene Helperran, Korrin Waterfield, PA-C 01/22/19  1122    Jacalyn LefevreHaviland, Julie, MD 01/22/19 [phone]

## 2019-01-23 LAB — RPR
RPR Ser Ql: REACTIVE — AB
RPR Titer: NONREACTIVE

## 2019-01-23 LAB — HIV ANTIBODY (ROUTINE TESTING W REFLEX): HIV Screen 4th Generation wRfx: NONREACTIVE

## 2019-01-24 LAB — GC/CHLAMYDIA PROBE AMP (~~LOC~~) NOT AT ARMC
Chlamydia: NEGATIVE
Neisseria Gonorrhea: NEGATIVE

## 2019-01-24 LAB — T.PALLIDUM AB, TOTAL: T Pallidum Abs: REACTIVE — AB

## 2019-01-27 ENCOUNTER — Other Ambulatory Visit: Payer: Self-pay

## 2019-01-27 ENCOUNTER — Encounter (HOSPITAL_COMMUNITY): Payer: Self-pay | Admitting: Physician Assistant

## 2019-01-27 ENCOUNTER — Ambulatory Visit (HOSPITAL_COMMUNITY)
Admission: RE | Admit: 2019-01-27 | Discharge: 2019-01-27 | Disposition: A | Discharge status: Home / Self Care | Payer: Self-pay | Source: Ambulatory Visit | Attending: Physician Assistant | Admitting: Physician Assistant

## 2019-01-27 VITALS — BP 140/74 | HR 47 | Ht 72.0 in | Wt 179.0 lb

## 2019-01-27 DIAGNOSIS — Z7901 Long term (current) use of anticoagulants: Secondary | ICD-10-CM | POA: Insufficient documentation

## 2019-01-27 DIAGNOSIS — I480 Paroxysmal atrial fibrillation: Secondary | ICD-10-CM | POA: Insufficient documentation

## 2019-01-27 MED ORDER — METOPROLOL TARTRATE 25 MG PO TABS: ORAL_TABLET | ORAL | 3 refills | Status: AC

## 2019-01-27 NOTE — Patient Instructions (Signed)
Stop daily metoprolol  Use metoprolol 1 tablet as needed every 6 hours for afib Heart rate over 100

## 2019-01-27 NOTE — Progress Notes (Signed)
Primary Care Physician: Patient, No Pcp Per Primary Cardiologist: none Primary Electrophysiologist: none Referring Physician: [facility]   Keith Romero is a 53 y.o. male with a history of an inguinal hernia s/p repear and new onset atrial fibrillation who presents for follow up in the [facility].  The patient was initially diagnosed with atrial fibrillation on 12/22/18 after presenting to the ER with symptoms of heart racing which had started one day prior. He was found to be in rate controlled afib. Patient reports he had symptoms similar to this about 3 years ago. He was started on metoprolol at the ER. He denies any specific triggers. He denies significant snoring or alcohol use. He does report that he missed a dose of metoprolol and had an episode of heart racing which was brief. He admits he is anxious about his heart.  On follow up today, patient reports that he has not had any further heart racing or palpitations. Zio patch showed no afib and patient symptoms did not correlate with any arrhythmias. His echocardiogram showed preserved EF. He does note some fatigue/drowsinss with BB.   Today, he denies symptoms of palpitations, chest pain, shortness of breath, orthopnea, PND, lower extremity edema, dizziness, presyncope, syncope, snoring, daytime somnolence, bleeding, or neurologic sequela. The patient is tolerating medications without difficulties and is otherwise without complaint today.    Atrial Fibrillation Risk Factors:  he does not have symptoms or diagnosis of sleep apnea. he does not have a history of rheumatic fever. he does not have a history of alcohol use. The patient does not have a history of early familial atrial fibrillation or other arrhythmias.  he has a BMI of Body mass index is 24.28 kg/m.Marland Kitchen Filed Weights   01/27/19 1147  Weight: 81.2 kg    No family history on file.   Atrial Fibrillation Management history:  Previous  antiarrhythmic drugs: none Previous cardioversions: none Previous ablations: none CHADS2VASC score: 0 Anticoagulation history: none   Past Medical History:  Diagnosis Date  . Heart murmur    "when I was young" (11/30/2016)   Past Surgical History:  Procedure Laterality Date  . EXPLORATORY LAPAROTOMY  11/27/2016  . INGUINAL HERNIA REPAIR Right 11/27/2016  . INGUINAL HERNIA REPAIR Right 11/27/2016   Procedure: RIGHT INGUINAL HERNIA REPAIR;  Surgeon: Andria Meuse, MD;  Location: [facility] OR;  Service: General;  Laterality: Right;  . INSERTION OF MESH Right 11/27/2016   Procedure: INSERTION OF MESH;  Surgeon: Andria Meuse, MD;  Location: [facility] OR;  Service: General;  Laterality: Right;  . LAPAROSCOPY N/A 11/27/2016   Procedure: LAPAROSCOPY DIAGNOSTIC;  Surgeon: Andria Meuse, MD;  Location: [facility] OR;  Service: General;  Laterality: N/A;  . LAPAROTOMY N/A 11/27/2016   Procedure: EXPLORATORY LAPAROTOMY;  Surgeon: Andria Meuse, MD;  Location: [facility] OR;  Service: General;  Laterality: N/A;    Current Outpatient Medications  Medication Sig Dispense Refill  . metoprolol tartrate (LOPRESSOR) 25 MG tablet Take 1 tablet every 6 hours as needed for afib HR over 100 30 tablet 3   No current facility-administered medications for this encounter.    Allergies  Allergen Reactions  . Penicillins Hives    Social History   Socioeconomic History  . Marital status: Single    Spouse name: Not on file  . Number of children: Not on file  . Years of education: Not on file  . Highest education level: Not on file  Occupational History  .  Not on file  Tobacco Use  . Smoking status: Never Smoker  . Smokeless tobacco: Never Used  Substance and Sexual Activity  . Alcohol use: Not Currently    Alcohol/week: 2.0 standard drinks    Types: 2 Cans of beer per week  . Drug use: Not Currently    Types: Marijuana    Comment: 11/30/2016 "last used last week"  . Sexual activity: Yes    Other Topics Concern  . Not on file  Social History Narrative  . Not on file   Social Determinants of Health   Financial Resource Strain:   . Difficulty of Paying Living Expenses: Not on file  Food Insecurity:   . Worried About Programme researcher, broadcasting/film/videounning Out of Food in the Last Year: Not on file  . Ran Out of Food in the Last Year: Not on file  Transportation Needs:   . Lack of Transportation (Medical): Not on file  . Lack of Transportation (Non-Medical): Not on file  Physical Activity:   . Days of Exercise per Week: Not on file  . Minutes of Exercise per Session: Not on file  Stress:   . Feeling of Stress : Not on file  Social Connections:   . Frequency of Communication with Friends and Family: Not on file  . Frequency of Social Gatherings with Friends and Family: Not on file  . Attends Religious Services: Not on file  . Active Member of Clubs or Organizations: Not on file  . Attends BankerClub or Organization Meetings: Not on file  . Marital Status: Not on file  Intimate Partner Violence:   . Fear of Current or Ex-Partner: Not on file  . Emotionally Abused: Not on file  . Physically Abused: Not on file  . Sexually Abused: Not on file     ROS- All systems are reviewed and negative except as per the HPI above.  Physical Exam: Vitals:   01/27/19 1147  BP: 140/74  Pulse: (!) 47  Weight: 81.2 kg  Height: 6' (1.829 m)    GEN- The patient is well appearing, alert and oriented x 3 today.   HEENT-head normocephalic, atraumatic, sclera clear, conjunctiva pink, hearing intact, trachea midline. Lungs- Clear to ausculation bilaterally, normal work of breathing Heart- Regular rhythm, bradycardia, no murmurs, rubs or gallops  GI- soft, NT, ND, + BS Extremities- no clubbing, cyanosis, or edema MS- no significant deformity or atrophy Skin- no rash or lesion Psych- euthymic mood, full affect Neuro- strength and sensation are intact   Wt Readings from Last 3 Encounters:  01/27/19 81.2 kg   12/26/18 75.7 kg  07/02/18 81.6 kg    EKG today demonstrates SB HR 47, PR 168, QRS 98, QTc 369  Echo 01/20/19  1. Left ventricular ejection fraction, by visual estimation, is 60 to 65%. The left ventricle has normal function. There is mildly increased left ventricular hypertrophy.  2. The left ventricle has no regional wall motion abnormalities.  3. Global right ventricle has normal systolic function.The right ventricular size is normal. No increase in right ventricular wall thickness.  4. Left atrial size was normal.  5. Right atrial size was normal.  6. The mitral valve is normal in structure. Trivial mitral valve regurgitation. No evidence of mitral stenosis.  7. The tricuspid valve is normal in structure. Tricuspid valve regurgitation is trivial.  8. The aortic valve is normal in structure. Aortic valve regurgitation is not visualized. No evidence of aortic valve sclerosis or stenosis.  9. The pulmonic valve was normal in  structure. Pulmonic valve regurgitation is trivial. 10. Mildly elevated pulmonary artery systolic pressure. 11. The inferior vena cava is normal in size with greater than 50% respiratory variability, suggesting right atrial pressure of 3 mmHg. 12. The average left ventricular global longitudinal strain is -20.7 %.  Epic records are reviewed at length today  Assessment and Plan:  1. Paroxysmal atrial fibrillation Patient appears to be maintaining SR. Zio patch showed no afib. Will change Lopressor 12.5 mg to PRN q 6hrs for heart racing given bradycardia with fatigue. No anticoagulation indicated at this time.  This patients CHA2DS2-VASc Score and unadjusted Ischemic Stroke Rate (% per year) is equal to 0.2 % stroke rate/year from a score of 0  Above score calculated as 1 point each if present [CHF, HTN, DM, Vascular=MI/PAD/Aortic Plaque, Age if 65-74, or Male] Above score calculated as 2 points each if present [Age > 75, or Stroke/TIA/TE]   Follow up in the  AF clinic in 3 months.    [facility] [address] [place], [place] [address] [phone] 01/27/2019 12:00 PM

## 2019-04-27 ENCOUNTER — Encounter (HOSPITAL_COMMUNITY): Payer: Self-pay

## 2019-04-27 ENCOUNTER — Ambulatory Visit (HOSPITAL_COMMUNITY): Payer: Self-pay | Admitting: Physician Assistant

## 2019-10-06 ENCOUNTER — Encounter (HOSPITAL_COMMUNITY): Payer: Self-pay

## 2019-10-06 ENCOUNTER — Emergency Department (HOSPITAL_COMMUNITY)
Admission: EM | Admit: 2019-10-06 | Discharge: 2019-10-07 | Disposition: A | Discharge status: Home / Self Care | Payer: Self-pay | Attending: Emergency Medicine | Admitting: Emergency Medicine

## 2019-10-06 ENCOUNTER — Other Ambulatory Visit: Payer: Self-pay

## 2019-10-06 DIAGNOSIS — R531 Weakness: Secondary | ICD-10-CM | POA: Insufficient documentation

## 2019-10-06 DIAGNOSIS — R5381 Other malaise: Secondary | ICD-10-CM | POA: Insufficient documentation

## 2019-10-06 DIAGNOSIS — R5383 Other fatigue: Secondary | ICD-10-CM | POA: Insufficient documentation

## 2019-10-06 LAB — CBG MONITORING, ED: Glucose-Capillary: 95 mg/dL (ref 70–99)

## 2019-10-06 LAB — BASIC METABOLIC PANEL
Anion gap: 10 (ref 5–15)
BUN: 10 mg/dL (ref 6–20)
CO2: 25 mmol/L (ref 22–32)
Calcium: 9.6 mg/dL (ref 8.9–10.3)
Chloride: 104 mmol/L (ref 98–111)
Creatinine, Ser: 0.82 mg/dL (ref 0.61–1.24)
GFR calc Af Amer: >60 mL/min (ref >60)
GFR calc non Af Amer: >60 mL/min (ref >60)
Glucose, Bld: 119 mg/dL — ABNORMAL HIGH (ref 70–99)
Potassium: 3.8 mmol/L (ref 3.5–5.1)
Sodium: 139 mmol/L (ref 135–145)

## 2019-10-06 LAB — CBC
HCT: 47.6 % (ref 39.0–52.0)
Hemoglobin: 15 g/dL (ref 13.0–17.0)
MCH: 32.8 pg (ref 26.0–34.0)
MCHC: 31.5 g/dL (ref 30.0–36.0)
MCV: 104.2 fL — ABNORMAL HIGH (ref 80.0–100.0)
Platelets: 208 10*3/uL (ref 150–400)
RBC: 4.57 MIL/uL (ref 4.22–5.81)
RDW: 11.9 % (ref 11.5–15.5)
WBC: 7.5 10*3/uL (ref 4.0–10.5)
nRBC: 0 % (ref 0.0–0.2)

## 2019-10-06 LAB — URINALYSIS, ROUTINE W REFLEX MICROSCOPIC
Bilirubin Urine: NEGATIVE
Glucose, UA: NEGATIVE mg/dL
Hgb urine dipstick: NEGATIVE
Ketones, ur: NEGATIVE mg/dL
Leukocytes,Ua: NEGATIVE
Nitrite: NEGATIVE
Protein, ur: 30 mg/dL — AB
Specific Gravity, Urine: 1.035 — ABNORMAL HIGH (ref 1.005–1.030)
pH: 5 (ref 5.0–8.0)

## 2019-10-06 NOTE — ED Triage Notes (Signed)
Pt presents with feeling weak and dehydrated since Wednesday.

## 2019-10-07 NOTE — ED Provider Notes (Signed)
[facility] EMERGENCY DEPARTMENT Provider Note   CSN: [id number] Arrival date & time: 10/06/19  1051     History Chief Complaint  Patient presents with  . Weakness    Keith Romero is a 54 y.o. male.  54 year old male with a history of paroxysmal atrial fibrillation presents to the emergency department for complaints of generalized fatigue and malaise.  Reports that symptoms began while at work on Wednesday.  He attributes onset of his symptoms to the hot environment in which he works.  Suspects he may be dehydrated, but has been tolerating PO.  No fevers, syncope, chest pain, SOB, vomiting.  No medications taken PTA for symptoms.  Does report that his symptoms have spontaneously improved and he is not presently symptomatic.  The history is provided by the patient. No language interpreter was used.  Weakness      Past Medical History:  Diagnosis Date  . Heart murmur    "when I was young" (11/30/2016)    Patient Active Problem List   Diagnosis Date Noted  . Paroxysmal atrial fibrillation (HCC) 12/26/2018  . Reducible right inguinal hernia 11/27/2016    Past Surgical History:  Procedure Laterality Date  . EXPLORATORY LAPAROTOMY  11/27/2016  . INGUINAL HERNIA REPAIR Right 11/27/2016  . INGUINAL HERNIA REPAIR Right 11/27/2016   Procedure: RIGHT INGUINAL HERNIA REPAIR;  Surgeon: Andria Meuse, MD;  [facility];  Service: General;  Laterality: Right;  . INSERTION OF MESH Right 11/27/2016   Procedure: INSERTION OF MESH;  Surgeon: Andria Meuse, MD;  Location: [facility] OR;  Service: General;  Laterality: Right;  . LAPAROSCOPY N/A 11/27/2016   Procedure: LAPAROSCOPY DIAGNOSTIC;  Surgeon: Andria Meuse, MD;  Location: [facility] OR;  Service: General;  Laterality: N/A;  . LAPAROTOMY N/A 11/27/2016   Procedure: EXPLORATORY LAPAROTOMY;  Surgeon: Andria Meuse, MD;  Location: [facility] OR;  Service: General;  Laterality: N/A;       History reviewed.  No pertinent family history.  Social History   Tobacco Use  . Smoking status: Never Smoker  . Smokeless tobacco: Never Used  Vaping Use  . Vaping Use: Never used  Substance Use Topics  . Alcohol use: Not Currently    Alcohol/week: 2.0 standard drinks    Types: 2 Cans of beer per week  . Drug use: Not Currently    Types: Marijuana    Comment: 11/30/2016 "last used last week"    Home Medications Prior to Admission medications   Medication Sig Start Date End Date Taking? Authorizing Provider  metoprolol tartrate (LOPRESSOR) 25 MG tablet Take 1 tablet every 6 hours as needed for afib HR over 100 Patient not taking: Reported on 10/07/2019 01/27/19   Fenton, Clint R, PA    Allergies    Penicillins  Review of Systems   Review of Systems  Neurological: Positive for weakness.  Ten systems reviewed and are negative for acute change, except as noted in the HPI.    Physical Exam Updated Vital Signs BP 112/71 (BP Location: Right Arm)   Pulse 68   Temp 98.3 F (36.8 C) (Oral)   Resp 16   SpO2 99%   Physical Exam Vitals and nursing note reviewed.  Constitutional:      General: He is not in acute distress.    Appearance: He is well-developed. He is not diaphoretic.     Comments: Nontoxic appearing and in NAD  HENT:     Head: Normocephalic and atraumatic.  Eyes:  General: No scleral icterus.    Conjunctiva/sclera: Conjunctivae normal.  Cardiovascular:     Rate and Rhythm: Normal rate and regular rhythm.     Pulses: Normal pulses.  Pulmonary:     Effort: Pulmonary effort is normal. No respiratory distress.     Comments: Respirations even and unlabored Musculoskeletal:        General: Normal range of motion.     Cervical back: Normal range of motion.  Skin:    General: Skin is warm and dry.     Coloration: Skin is not pale.     Findings: No erythema or rash.  Neurological:     Mental Status: He is alert and oriented to person, place, and time.  Psychiatric:         Behavior: Behavior normal.     ED Results / Procedures / Treatments   Labs (all labs ordered are listed, but only abnormal results are displayed) Labs Reviewed  BASIC METABOLIC PANEL - Abnormal; Notable for the following components:      Result Value   Glucose, Bld 119 (*)    All other components within normal limits  CBC - Abnormal; Notable for the following components:   MCV 104.2 (*)    All other components within normal limits  URINALYSIS, ROUTINE W REFLEX MICROSCOPIC - Abnormal; Notable for the following components:   Color, Urine AMBER (*)    Specific Gravity, Urine 1.035 (*)    Protein, ur 30 (*)    Bacteria, UA RARE (*)    All other components within normal limits  CBG MONITORING, ED  GC/CHLAMYDIA PROBE AMP ([place]) NOT AT [facility]    EKG EKG Interpretation  Date/Time:  Friday October 06 2019 11:50:50 EDT Ventricular Rate:  61 PR Interval:  162 QRS Duration: 92 QT Interval:  412 QTC Calculation: 414 R Axis:   32 Text Interpretation: Normal sinus rhythm with sinus arrhythmia Normal ECG When compared with ECG of 01/27/2019, HEART RATE has increased Confirmed by Dione Booze ([id number]) on 10/06/2019 11:05:51 PM   Radiology No results found.  Procedures Procedures (including critical care time)  Medications Ordered in ED Medications - No data to display  ED Course  I have reviewed the triage vital signs and the nursing notes.  Pertinent labs & imaging results that were available during my care of the patient were reviewed by me and considered in my medical decision making (see chart for details).    MDM Rules/Calculators/A&P                          54 year old male presents to the emergency department for complaints of generalized fatigue and malaise which have spontaneously resolved.  Presently states that he feels fine, is asymptomatic.  He is hemodynamically stable.  Screening labs were completed in triage and reviewed.  These are reassuring without  leukocytosis, anemia, electrolyte derangements.  EKG shows sinus arrhythmia.  Not orthostatic.  Patient tolerating PO.    Low suspicion for emergent process. Will discharge with instruction to continue outpatient follow up.  Return precautions discussed and provided. Patient discharged in stable condition with no unaddressed concerns.   Final Clinical Impression(s) / ED Diagnoses Final diagnoses:  Malaise and fatigue    Rx / DC Orders ED Discharge Orders    None       Antony Madura, PA-C 10/07/19 [id number]    Nira Conn, MD 10/07/19 [phone]

## 2019-10-07 NOTE — ED Notes (Signed)
Patient verbalizes understanding of discharge instructions. Opportunity for questioning and answers were provided. Armband removed by staff, pt discharged from ED ambulatory to home.  

## 2019-10-07 NOTE — Discharge Instructions (Signed)
Your work-up in the emergency department today has been reassuring.  We advise follow-up with a primary care doctor.  Drink plenty of fluids and eat regular meals.  You may return for new or concerning symptoms.

## 2019-10-09 LAB — GC/CHLAMYDIA PROBE AMP (~~LOC~~) NOT AT ARMC
Chlamydia: NEGATIVE
Comment: NEGATIVE
Comment: NORMAL
Neisseria Gonorrhea: NEGATIVE

## 2019-10-18 ENCOUNTER — Encounter (HOSPITAL_COMMUNITY): Payer: Self-pay

## 2019-10-18 ENCOUNTER — Other Ambulatory Visit: Payer: Self-pay

## 2019-10-18 ENCOUNTER — Emergency Department (HOSPITAL_COMMUNITY)
Admission: EM | Admit: 2019-10-18 | Discharge: 2019-10-19 | Disposition: A | Discharge status: Home / Self Care | Payer: Self-pay | Attending: Emergency Medicine | Admitting: Emergency Medicine

## 2019-10-18 DIAGNOSIS — R102 Pelvic and perineal pain: Secondary | ICD-10-CM

## 2019-10-18 DIAGNOSIS — R1030 Lower abdominal pain, unspecified: Secondary | ICD-10-CM | POA: Insufficient documentation

## 2019-10-18 LAB — CBC
HCT: 40.7 % (ref 39.0–52.0)
Hemoglobin: 12.9 g/dL — ABNORMAL LOW (ref 13.0–17.0)
MCH: 33 pg (ref 26.0–34.0)
MCHC: 31.7 g/dL (ref 30.0–36.0)
MCV: 104.1 fL — ABNORMAL HIGH (ref 80.0–100.0)
Platelets: 201 10*3/uL (ref 150–400)
RBC: 3.91 MIL/uL — ABNORMAL LOW (ref 4.22–5.81)
RDW: 12.1 % (ref 11.5–15.5)
WBC: 6.5 10*3/uL (ref 4.0–10.5)
nRBC: 0 % (ref 0.0–0.2)

## 2019-10-18 LAB — URINALYSIS, ROUTINE W REFLEX MICROSCOPIC
Bilirubin Urine: NEGATIVE
Glucose, UA: NEGATIVE mg/dL
Hgb urine dipstick: NEGATIVE
Ketones, ur: NEGATIVE mg/dL
Leukocytes,Ua: NEGATIVE
Nitrite: NEGATIVE
Protein, ur: NEGATIVE mg/dL
Specific Gravity, Urine: 1.025 (ref 1.005–1.030)
pH: 6 (ref 5.0–8.0)

## 2019-10-18 LAB — COMPREHENSIVE METABOLIC PANEL
ALT: 20 U/L (ref 0–44)
AST: 23 U/L (ref 15–41)
Albumin: 3.8 g/dL (ref 3.5–5.0)
Alkaline Phosphatase: 55 U/L (ref 38–126)
Anion gap: 9 (ref 5–15)
BUN: 6 mg/dL (ref 6–20)
CO2: 29 mmol/L (ref 22–32)
Calcium: 9 mg/dL (ref 8.9–10.3)
Chloride: 101 mmol/L (ref 98–111)
Creatinine, Ser: 0.84 mg/dL (ref 0.61–1.24)
GFR calc Af Amer: >60 mL/min (ref >60)
GFR calc non Af Amer: >60 mL/min (ref >60)
Glucose, Bld: 111 mg/dL — ABNORMAL HIGH (ref 70–99)
Potassium: 3.9 mmol/L (ref 3.5–5.1)
Sodium: 139 mmol/L (ref 135–145)
Total Bilirubin: 1 mg/dL (ref 0.3–1.2)
Total Protein: 6.6 g/dL (ref 6.5–8.1)

## 2019-10-18 LAB — LIPASE, BLOOD: Lipase: 33 U/L (ref 11–51)

## 2019-10-18 NOTE — ED Triage Notes (Signed)
Patient complains of ongoing lower abdominal pain that is worse with ambulation. Denies dysuria and reports mild nausea. Seen previously for same

## 2019-10-19 ENCOUNTER — Emergency Department (HOSPITAL_COMMUNITY): Payer: Self-pay

## 2019-10-19 LAB — GC/CHLAMYDIA PROBE AMP (~~LOC~~) NOT AT ARMC
Chlamydia: NEGATIVE
Comment: NEGATIVE
Comment: NORMAL
Neisseria Gonorrhea: NEGATIVE

## 2019-10-19 MED ORDER — SODIUM CHLORIDE 0.9 % IV BOLUS
1000.0000 mL | Freq: Once | INTRAVENOUS | Status: AC
  Administered 2019-10-19: 1000 mL via INTRAVENOUS

## 2019-10-19 MED ORDER — IOHEXOL 300 MG/ML  SOLN
100.0000 mL | Freq: Once | INTRAMUSCULAR | Status: AC | PRN
  Administered 2019-10-19: 100 mL via INTRAVENOUS

## 2019-10-19 MED ORDER — TRAMADOL HCL 50 MG PO TABS: 50.0000 mg | ORAL_TABLET | Freq: Four times a day (QID) | ORAL | 0 refills | Status: AC | PRN

## 2019-10-19 NOTE — ED Notes (Signed)
Pt given work note- advised to take 600mg  motrin 3x day, if no relief w motrin- can try tramadol. Tramadol sent to pharmacy. Pt given work note, pt understood, no questions, in NAD, ready for discharge

## 2019-10-19 NOTE — Discharge Instructions (Addendum)
Take ibuprofen 600 mg 3 times daily for the next 5 days.  Begin taking tramadol as needed for pain not relieved with ibuprofen.  Follow-up with your primary doctor if symptoms or not improving in the next few days, and return to the ER if you develop worsening pain, high fever, bloody stool, or other new and concerning symptoms.

## 2019-10-19 NOTE — ED Provider Notes (Signed)
[facility] EMERGENCY DEPARTMENT Provider Note   CSN: [id number] Arrival date & time: 10/18/19  1116     History No chief complaint on file.   Keith Romero is a 54 y.o. male.  Patient is a 54 year old male with past medical history of paroxysmal A. fib and prior right inguinal hernia repair.  He presents today with complaints of suprapubic abdominal pain.  This has been ongoing for the past 4 days.  This began in the absence of any injury or trauma.  This discomfort is worse when he stands and ambulates and presses on the area.  He denies any bowel or bladder complaints.  He denies any urinary complaints.  Patient is sexually active with 1 partner.  He denies any dysuria or discharge.  The history is provided by the patient.       Past Medical History:  Diagnosis Date  . Heart murmur    "when I was young" (11/30/2016)    Patient Active Problem List   Diagnosis Date Noted  . Paroxysmal atrial fibrillation (HCC) 12/26/2018  . Reducible right inguinal hernia 11/27/2016    Past Surgical History:  Procedure Laterality Date  . EXPLORATORY LAPAROTOMY  11/27/2016  . INGUINAL HERNIA REPAIR Right 11/27/2016  . INGUINAL HERNIA REPAIR Right 11/27/2016   Procedure: RIGHT INGUINAL HERNIA REPAIR;  Surgeon: Andria Meuse, MD;  [facility];  Service: General;  Laterality: Right;  . INSERTION OF MESH Right 11/27/2016   Procedure: INSERTION OF MESH;  Surgeon: Andria Meuse, MD;  Location: [facility];  Service: General;  Laterality: Right;  . LAPAROSCOPY N/A 11/27/2016   Procedure: LAPAROSCOPY DIAGNOSTIC;  Surgeon: Andria Meuse, MD;  Location[facility] OR;  Service: General;  Laterality: N/A;  . LAPAROTOMY N/A 11/27/2016   Procedure: EXPLORATORY LAPAROTOMY;  Surgeon: Andria Meuse, MD;  Location: [facility] OR;  Service: General;  Laterality: N/A;       No family history on file.  Social History   Tobacco Use  . Smoking status: Never Smoker  .  Smokeless tobacco: Never Used  Vaping Use  . Vaping Use: Never used  Substance Use Topics  . Alcohol use: Not Currently    Alcohol/week: 2.0 standard drinks    Types: 2 Cans of beer per week  . Drug use: Not Currently    Types: Marijuana    Comment: 11/30/2016 "last used last week"    Home Medications Prior to Admission medications   Medication Sig Start Date End Date Taking? Authorizing Provider  metoprolol tartrate (LOPRESSOR) 25 MG tablet Take 1 tablet every 6 hours as needed for afib HR over 100 Patient not taking: Reported on 10/07/2019 01/27/19   Fenton, Levonne Spiller R, PA    Allergies    Penicillins  Review of Systems   Review of Systems  All other systems reviewed and are negative.   Physical Exam Updated Vital Signs BP 120/83 (BP Location: Right Arm)   Pulse (!) 54   Temp 98.8 F (37.1 C) (Oral)   Resp 15   Ht 6' (1.829 m)   Wt 85.3 kg   SpO2 100%   BMI 25.50 kg/m   Physical Exam Vitals and nursing note reviewed.  Constitutional:      General: He is not in acute distress.    Appearance: He is well-developed. He is not diaphoretic.  HENT:     Head: Normocephalic and atraumatic.  Cardiovascular:     Rate and Rhythm: Normal rate and regular rhythm.  Heart sounds: No murmur heard.  No friction rub.  Pulmonary:     Effort: Pulmonary effort is normal. No respiratory distress.     Breath sounds: Normal breath sounds. No wheezing or rales.  Abdominal:     General: Bowel sounds are normal. There is no distension.     Palpations: Abdomen is soft.     Tenderness: There is abdominal tenderness. There is no right CVA tenderness, left CVA tenderness, guarding or rebound.     Comments: Prior midline surgical scar and right inguinal surgical scar is noted.  There is no palpable hernia or mass.  He does have mild tenderness in the suprapubic region with no rebound or guarding.  Musculoskeletal:        General: Normal range of motion.     Cervical back: Normal range of  motion and neck supple.  Skin:    General: Skin is warm and dry.  Neurological:     Mental Status: He is alert and oriented to person, place, and time.     Coordination: Coordination normal.     ED Results / Procedures / Treatments   Labs (all labs ordered are listed, but only abnormal results are displayed) Labs Reviewed  COMPREHENSIVE METABOLIC PANEL - Abnormal; Notable for the following components:      Result Value   Glucose, Bld 111 (*)    All other components within normal limits  CBC - Abnormal; Notable for the following components:   RBC 3.91 (*)    Hemoglobin 12.9 (*)    MCV 104.1 (*)    All other components within normal limits  URINALYSIS, ROUTINE W REFLEX MICROSCOPIC - Abnormal; Notable for the following components:   APPearance HAZY (*)    All other components within normal limits  LIPASE, BLOOD  GC/CHLAMYDIA PROBE AMP ([place]) NOT AT [facility]    EKG None  Radiology No results found.  Procedures Procedures (including critical care time)  Medications Ordered in ED Medications  sodium chloride 0.9 % bolus 1,000 mL (has no administration in time range)    ED Course  I have reviewed the triage vital signs and the nursing notes.  Pertinent labs & imaging results that were available during my care of the patient were reviewed by me and considered in my medical decision making (see chart for details).    MDM Rules/Calculators/A&P  Patient presents with complaints of suprapubic abdominal pain for the past several days.  This seems to be worse with ambulation and relieved with rest.  He has history of prior hernia repairs, however this seems different.  I am unable to palpate a hernia on exam and CT scan shows no identifiable intra-abdominal process.  Laboratory studies are reassuring and urinalysis is unremarkable.  GC and Chlamydia cultures are pending.  At this point, I see nothing that appears emergent.  Patient will be treated with anti-inflammatory  medications for possible abdominal wall pain.  He is to follow-up if not improving in the next week.  Final Clinical Impression(s) / ED Diagnoses Final diagnoses:  None    Rx / DC Orders ED Discharge Orders    None       Geoffery Lyons, MD 10/19/19 [phone]

## 2019-10-19 NOTE — ED Notes (Signed)
Patient transported to CT 

## 2020-02-14 IMAGING — CR DG CHEST 2V
2 series · 2 of 2 positions shown · non-contrast
Comparison: None.

CLINICAL DATA: Fatigue with exertion.

EXAM:
CHEST - 2 VIEW

[chest pa]
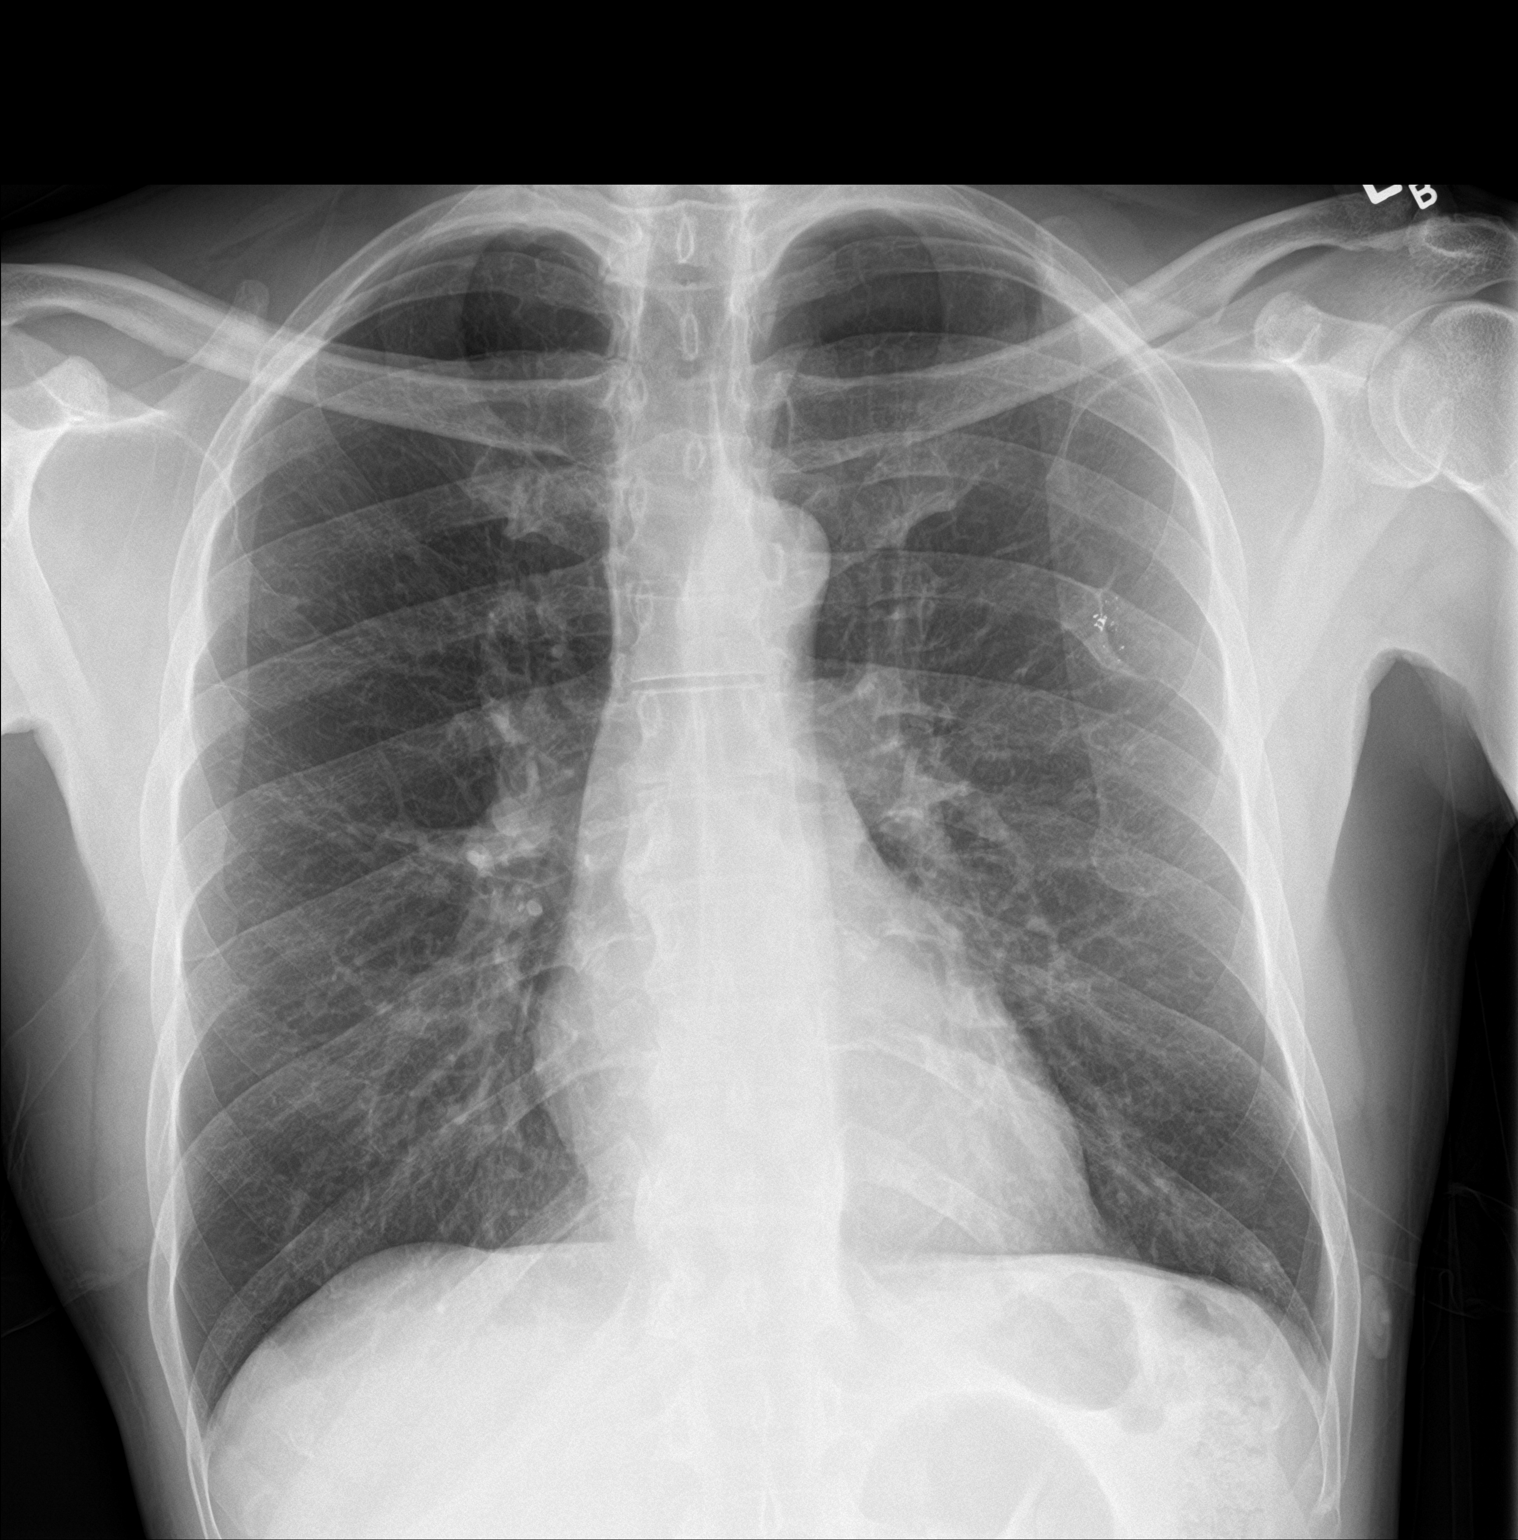

[chest lat]
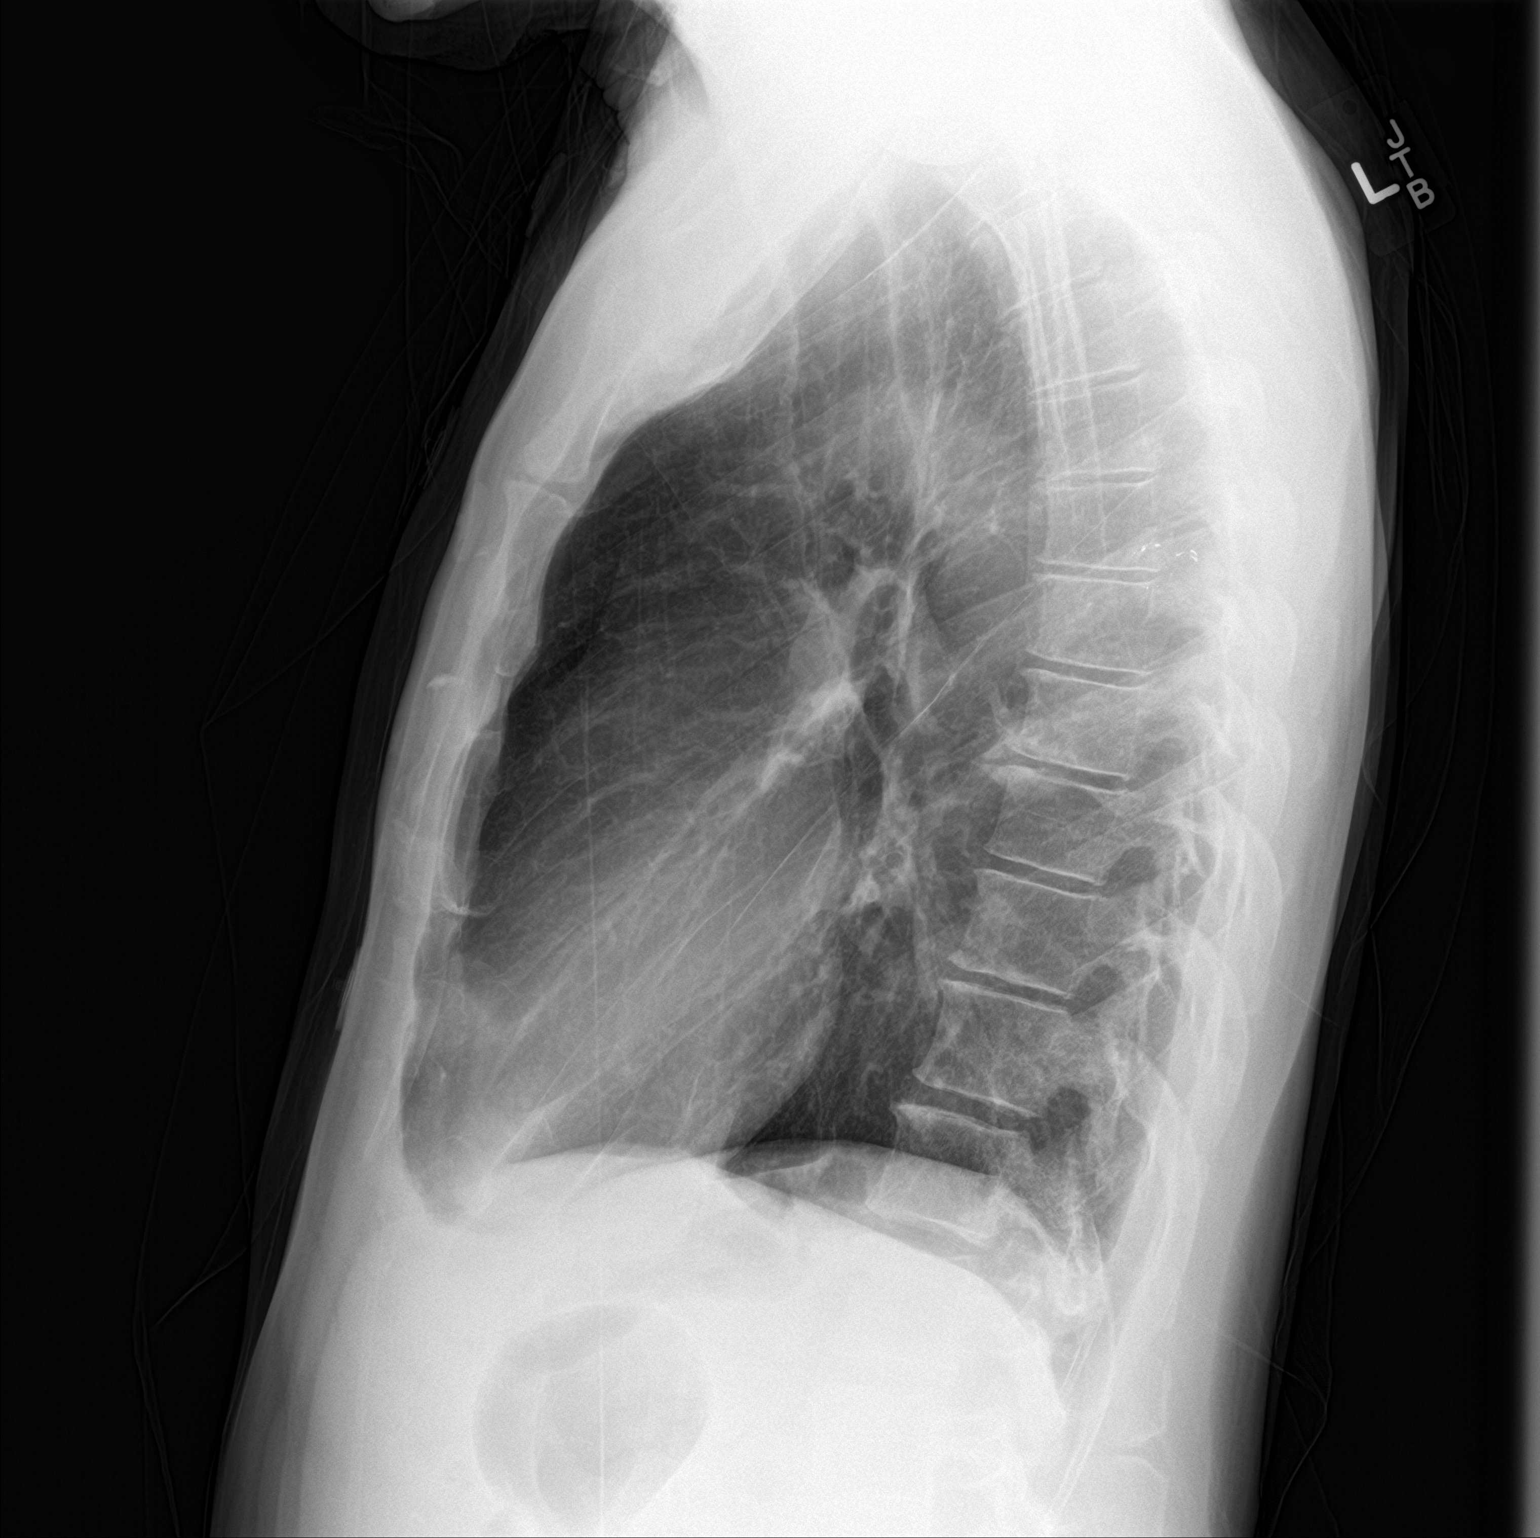

[2 of 2 positions shown; findings below may reference images not displayed]

FINDINGS: The heart size and mediastinal contours are within normal limits.
Both lungs are clear. No pneumothorax or pleural effusion is noted.
The visualized skeletal structures are unremarkable.
IMPRESSION: No active cardiopulmonary disease.

## 2020-04-23 ENCOUNTER — Other Ambulatory Visit: Payer: Self-pay

## 2020-04-23 ENCOUNTER — Encounter (HOSPITAL_COMMUNITY): Payer: Self-pay | Admitting: Emergency Medicine

## 2020-04-23 ENCOUNTER — Emergency Department (HOSPITAL_COMMUNITY)
Admission: EM | Admit: 2020-04-23 | Discharge: 2020-04-23 | Disposition: A | Discharge status: Home / Self Care | Payer: Self-pay | Attending: Emergency Medicine | Admitting: Emergency Medicine

## 2020-04-23 DIAGNOSIS — A64 Unspecified sexually transmitted disease: Secondary | ICD-10-CM | POA: Insufficient documentation

## 2020-04-23 DIAGNOSIS — R369 Urethral discharge, unspecified: Secondary | ICD-10-CM | POA: Insufficient documentation

## 2020-04-23 DIAGNOSIS — M7989 Other specified soft tissue disorders: Secondary | ICD-10-CM | POA: Insufficient documentation

## 2020-04-23 DIAGNOSIS — I861 Scrotal varices: Secondary | ICD-10-CM | POA: Insufficient documentation

## 2020-04-23 DIAGNOSIS — N433 Hydrocele, unspecified: Secondary | ICD-10-CM | POA: Insufficient documentation

## 2020-04-23 LAB — URINALYSIS, ROUTINE W REFLEX MICROSCOPIC
Bilirubin Urine: NEGATIVE
Glucose, UA: NEGATIVE mg/dL
Hgb urine dipstick: NEGATIVE
Ketones, ur: NEGATIVE mg/dL
Nitrite: NEGATIVE
Protein, ur: NEGATIVE mg/dL
Specific Gravity, Urine: 1.019 (ref 1.005–1.030)
WBC, UA: >50 WBC/hpf — ABNORMAL HIGH (ref 0–5)
pH: 6 (ref 5.0–8.0)

## 2020-04-23 MED ORDER — AZITHROMYCIN 250 MG PO TABS
2000.0000 mg | ORAL_TABLET | Freq: Once | ORAL | Status: AC
  Administered 2020-04-23: 2000 mg via ORAL
  Filled 2020-04-23: qty 8

## 2020-04-23 MED ORDER — GENTAMICIN SULFATE 40 MG/ML IJ SOLN
240.0000 mg | Freq: Once | INTRAMUSCULAR | Status: AC
  Administered 2020-04-23: 240 mg via INTRAMUSCULAR
  Filled 2020-04-23: qty 6

## 2020-04-23 NOTE — ED Provider Notes (Signed)
[facility] EMERGENCY DEPARTMENT Provider Note   CSN: [id number] Arrival date & time: 04/23/20  1133     History No chief complaint on file.   Keith Romero is a 55 y.o. male with a history of paroxysmal atrial fibrillation, status post inguinal hernia repair.  Patient presents with a chief complaint of dysuria and penile discharge.  Patient reports that his dysuria has been present for the last 2 days.  Patient reports that his penile discharge started this morning.  He reports seeing penile discharge after urinating.  Describes this discharge as a milky white color.  Denies any genital sores or lesions, penile tenderness or swelling, scrotal or testicle tenderness, erythema to genitals.  Patient does endorse photo swelling however reports this has been present since he had a hernia repair done in 2018 and it has not changed.  Patient reports that he had unprotected sexual intercourse with a male partner 1 week prior.  Patient is unsure if this partner tested positive for gonorrhea or chlamydia.    Patient denies any fevers, chills, abdominal pain, nausea, vomiting, hematuria, urinary frequency, rectal pain, rectal bleeding, blood in stool, or melena.  Patient denies any receptive anal sex.  She endorses previous sexual transmitted infections however states it has been "years," since his last one.  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  HPI     Past Medical History:  Diagnosis Date  . Heart murmur    "when I was young" (11/30/2016)    Patient Active Problem List   Diagnosis Date Noted  . Paroxysmal atrial fibrillation (HCC) 12/26/2018  . Reducible right inguinal hernia 11/27/2016    Past Surgical History:  Procedure Laterality Date  . EXPLORATORY LAPAROTOMY  11/27/2016  . INGUINAL HERNIA REPAIR Right 11/27/2016  . INGUINAL HERNIA REPAIR Right 11/27/2016   Procedure: RIGHT INGUINAL HERNIA REPAIR;  Surgeon:  Andria Meuse, MD;  Location: [facility] OR;  Service: General;  Laterality: Right;  . INSERTION OF MESH Right 11/27/2016   Procedure: INSERTION OF MESH;  Surgeon: Andria Meuse, MD;  Location: [facility] OR;  Service: General;  Laterality: Right;  . LAPAROSCOPY N/A 11/27/2016   Procedure: LAPAROSCOPY DIAGNOSTIC;  Surgeon: Andria Meuse, MD;  Location: [facility] OR;  Service: General;  Laterality: N/A;  . LAPAROTOMY N/A 11/27/2016   Procedure: EXPLORATORY LAPAROTOMY;  Surgeon: Andria Meuse, MD;  Location: [facility] OR;  Service: General;  Laterality: N/A;       No family history on file.  Social History   Tobacco Use  . Smoking status: Never Smoker  . Smokeless tobacco: Never Used  Vaping Use  . Vaping Use: Never used  Substance Use Topics  . Alcohol use: Not Currently    Alcohol/week: 2.0 standard drinks    Types: 2 Cans of beer per week  . Drug use: Not Currently    Types: Marijuana    Comment: 11/30/2016 "last used last week"    Home Medications Prior to Admission medications   Medication Sig Start Date End Date Taking? Authorizing Provider  metoprolol tartrate (LOPRESSOR) 25 MG tablet Take 1 tablet every 6 hours as needed for afib HR over 100 Patient not taking: Reported on 10/07/2019 01/27/19   Fenton, Clint R, PA  traMADol (ULTRAM) 50 MG tablet Take 1 tablet (50 mg total) by mouth every 6 (six) hours as needed. 10/19/19   Geoffery Lyons, MD    Allergies    Penicillins  Review of  Systems   Review of Systems  Constitutional: Negative for chills and fever.  Gastrointestinal: Negative for abdominal distention, abdominal pain, anal bleeding, blood in stool, constipation, diarrhea, nausea, rectal pain and vomiting.  Genitourinary: Positive for dysuria, penile discharge and scrotal swelling. Negative for decreased urine volume, difficulty urinating, flank pain, frequency, genital sores, hematuria, penile pain, penile swelling and testicular pain.  Musculoskeletal: Negative for  back pain, myalgias and neck pain.  Skin: Negative for color change, pallor, rash and wound.    Physical Exam Updated Vital Signs BP (!) 141/66 (BP Location: Left Arm)   Pulse 70   Temp 98.4 F (36.9 C)   Resp 17   SpO2 98%   Physical Exam Vitals and nursing note reviewed. Exam conducted with a chaperone present (Male RN present as chaperone).  Constitutional:      General: He is not in acute distress.    Appearance: He is normal weight. He is not ill-appearing, toxic-appearing or diaphoretic.  HENT:     Head: Normocephalic.  Eyes:     General: No scleral icterus.       Right eye: No discharge.        Left eye: No discharge.  Cardiovascular:     Rate and Rhythm: Normal rate.  Pulmonary:     Effort: Pulmonary effort is normal. No respiratory distress.     Breath sounds: No stridor.  Abdominal:     General: Abdomen is flat. Bowel sounds are normal. There is no distension. There are no signs of injury.     Palpations: Abdomen is soft. There is no mass or pulsatile mass.     Tenderness: There is no abdominal tenderness. There is no guarding or rebound.     Hernia: No hernia is present. There is no hernia in the umbilical area or ventral area.  Genitourinary:    Pubic Area: No rash or pubic lice.      Penis: Circumcised. No erythema, tenderness, discharge, swelling or lesions.      Testes:        Right: Swelling present. Mass, tenderness or varicocele not present.        Left: Swelling present. Mass, tenderness or varicocele not present.     Epididymis:     Right: Not inflamed or enlarged. No mass or tenderness.     Left: Not inflamed or enlarged. No mass or tenderness.     Tanner stage (genital): 5.     Comments: Patient has swelling to the bilateral scrotum with right greater than left. no tenderness to palpation or erythema Skin:    General: Skin is warm and dry.  Neurological:     General: No focal deficit present.     Mental Status: He is alert.  Psychiatric:         Behavior: Behavior is cooperative.     ED Results / Procedures / Treatments   Labs (all labs ordered are listed, but only abnormal results are displayed) Labs Reviewed  URINALYSIS, ROUTINE W REFLEX MICROSCOPIC - Abnormal; Notable for the following components:      Result Value   APPearance HAZY (*)    Leukocytes,Ua MODERATE (*)    WBC, UA >50 (*)    Bacteria, UA RARE (*)    All other components within normal limits  URINE CULTURE  GC/CHLAMYDIA PROBE AMP ([place]) NOT AT [facility]    EKG None  Radiology No results found.  Procedures Procedures   Medications Ordered in ED Medications  gentamicin (GARAMYCIN) injection  240 mg (240 mg Intramuscular Given 04/23/20 1317)  azithromycin (ZITHROMAX) tablet 2,000 mg (2,000 mg Oral Given 04/23/20 1316)    ED Course  I have reviewed the triage vital signs and the nursing notes.  Pertinent labs & imaging results that were available during my care of the patient were reviewed by me and considered in my medical decision making (see chart for details).    MDM Rules/Calculators/A&P                          Alert 55 year old male no acute distress, nontoxic appearing.  Patient presents with chief complaint of dysuria and penile discharge 1 week after having unprotected sex with male partner.  Unknown if patient's partner is positive for gonorrhea or chlamydia.  Will swab for gonorrhea chlamydia.  Will treat patient with gonorrhea chlamydia.  Due to patient's history of penicillin allergy will treat with gentamicin and azithromycin.    UA showed bacteria rare, WBC greater than 50, leukocytes moderate, nitrite negative.  Low suspicion for urinary tract infection.  Will send for culture.    Physical exam patient is noted to have swelling to bilateral scrotum.  Patient reports this has been present ever since he had surgery for inguinal hernia.  Per chart review patient was seen for this issue emergency department on 07/02/2018.  Ultrasound  performed showed bilateral hydroceles right greater than left also bilateral small varicoceles.  Will give patient information to follow-up with urology.    Final Clinical Impression(s) / ED Diagnoses Final diagnoses:  STI (sexually transmitted infection)    Rx / DC Orders ED Discharge Orders    None       Berneice Heinrich 04/23/20 1914    Blane Ohara, MD 04/25/20 [phone]

## 2020-04-23 NOTE — ED Triage Notes (Signed)
C/o penile pain x 2 days when urinating and clear penile discharge.

## 2020-04-23 NOTE — ED Notes (Signed)
Patient discharge instructions reviewed with the patient. The patient verbalized understanding. Patient discharged. 

## 2020-04-23 NOTE — Discharge Instructions (Signed)
You came to the emerge department today to be evaluated for your painful urination and penile discharge.  Based on your recent unprotected sexual intercourse you have been treated for gonorrhea and chlamydia empirically.  You were tested for gonorrhea and chlamydia however these tests are not present at this time.  You will be contacted if your results are positive.  They also view results on [facility].    He also showed concern for your scrotal swelling.  Based on previous imaging your swelling is due to hydroceles.  I will have you follow-up with urology for management of this issue.  et help right away if: Your pain gets worse and does not get better with medicine. You have abnormal discharge. You develop flu-like symptoms, such as night sweats, sore throat, or muscle aches.

## 2020-04-24 LAB — URINE CULTURE: Culture: NO GROWTH

## 2020-06-12 ENCOUNTER — Other Ambulatory Visit: Payer: Self-pay

## 2020-06-12 ENCOUNTER — Emergency Department (HOSPITAL_COMMUNITY)
Admission: EM | Admit: 2020-06-12 | Discharge: 2020-06-12 | Disposition: A | Discharge status: Home / Self Care | Payer: Self-pay | Attending: Emergency Medicine | Admitting: Emergency Medicine

## 2020-06-12 DIAGNOSIS — Z202 Contact with and (suspected) exposure to infections with a predominantly sexual mode of transmission: Secondary | ICD-10-CM | POA: Insufficient documentation

## 2020-06-12 DIAGNOSIS — R369 Urethral discharge, unspecified: Secondary | ICD-10-CM | POA: Insufficient documentation

## 2020-06-12 LAB — URINALYSIS, ROUTINE W REFLEX MICROSCOPIC
Bilirubin Urine: NEGATIVE
Glucose, UA: NEGATIVE mg/dL
Hgb urine dipstick: NEGATIVE
Ketones, ur: NEGATIVE mg/dL
Leukocytes,Ua: NEGATIVE
Nitrite: NEGATIVE
Protein, ur: NEGATIVE mg/dL
Specific Gravity, Urine: 1.026 (ref 1.005–1.030)
pH: 5 (ref 5.0–8.0)

## 2020-06-12 MED ORDER — DOXYCYCLINE HYCLATE 100 MG PO CAPS: 100.0000 mg | ORAL_CAPSULE | Freq: Two times a day (BID) | ORAL | 0 refills | Status: AC

## 2020-06-12 MED ORDER — CEFTRIAXONE SODIUM 500 MG IJ SOLR
500.0000 mg | Freq: Once | INTRAMUSCULAR | Status: AC
  Administered 2020-06-12: 500 mg via INTRAMUSCULAR
  Filled 2020-06-12: qty 500

## 2020-06-12 MED ORDER — LIDOCAINE HCL (PF) 1 % IJ SOLN
1.0000 mL | Freq: Once | INTRAMUSCULAR | Status: AC
  Administered 2020-06-12: 1 mL
  Filled 2020-06-12: qty 5

## 2020-06-12 NOTE — ED Provider Notes (Signed)
[facility] EMERGENCY DEPARTMENT Provider Note   CSN: [id number] Arrival date & time: 06/12/20  1104     History No chief complaint on file.   Keith Romero is a 55 y.o. male.  55 y.o male with a PMH of section transmitted infection presents to the ED with a chief complaint of penile discharge for the past 3 days.  Patient endorses milky white discharge that has been ongoing for the past 3 days.  Recently treated for STD at the end of March, reports he feels like the problem never got resolved.  States that his partner was recently treated a couple days ago with improvement in her symptoms.  She continues to be sexually active.  There is no abdominal pain, no urinary complaints, no testicular pain or swelling.    The history is provided by the patient.       Past Medical History:  Diagnosis Date  . Heart murmur    "when I was young" (11/30/2016)    Patient Active Problem List   Diagnosis Date Noted  . Paroxysmal atrial fibrillation (HCC) 12/26/2018  . Reducible right inguinal hernia 11/27/2016    Past Surgical History:  Procedure Laterality Date  . EXPLORATORY LAPAROTOMY  11/27/2016  . INGUINAL HERNIA REPAIR Right 11/27/2016  . INGUINAL HERNIA REPAIR Right 11/27/2016   Procedure: RIGHT INGUINAL HERNIA REPAIR;  Surgeon: Andria Meuse, MD;  Location: [facility] OR;  Service: General;  Laterality: Right;  . INSERTION OF MESH Right 11/27/2016   Procedure: INSERTION OF MESH;  Surgeon: Andria Meuse, MD;  Location: [facility] OR;  Service: General;  Laterality: Right;  . LAPAROSCOPY N/A 11/27/2016   Procedure: LAPAROSCOPY DIAGNOSTIC;  Surgeon: Andria Meuse, MD;  [facility];  Service: General;  Laterality: N/A;  . LAPAROTOMY N/A 11/27/2016   Procedure: EXPLORATORY LAPAROTOMY;  Surgeon: Andria Meuse, MD;  Location: [facility] OR;  Service: General;  Laterality: N/A;       No family history on file.  Social History   Tobacco Use  . Smoking  status: Never Smoker  . Smokeless tobacco: Never Used  Vaping Use  . Vaping Use: Never used  Substance Use Topics  . Alcohol use: Not Currently    Alcohol/week: 2.0 standard drinks    Types: 2 Cans of beer per week  . Drug use: Not Currently    Types: Marijuana    Comment: 11/30/2016 "last used last week"    Home Medications Prior to Admission medications   Medication Sig Start Date End Date Taking? Authorizing Provider  doxycycline (VIBRAMYCIN) 100 MG capsule Take 1 capsule (100 mg total) by mouth 2 (two) times daily for 7 days. 06/12/20 06/19/20 Yes Tametra Ahart, Leonie Douglas, PA-C  metoprolol tartrate (LOPRESSOR) 25 MG tablet Take 1 tablet every 6 hours as needed for afib HR over 100 Patient not taking: Reported on 10/07/2019 01/27/19   Fenton, Clint R, PA  traMADol (ULTRAM) 50 MG tablet Take 1 tablet (50 mg total) by mouth every 6 (six) hours as needed. 10/19/19   Geoffery Lyons, MD    Allergies    Penicillins  Review of Systems   Review of Systems  Constitutional: Negative for fever.  Gastrointestinal: Negative for abdominal pain.  Genitourinary: Positive for penile discharge. Negative for penile pain, penile swelling, scrotal swelling and testicular pain.    Physical Exam Updated Vital Signs BP 128/90 (BP Location: Right Arm)   Pulse 66   Temp 98.3 F (36.8 C) (Oral)   Resp 17  SpO2 99%   Physical Exam Vitals and nursing note reviewed. Exam conducted with a chaperone present.  Genitourinary:    Penis: Circumcised. Discharge present.      Testes: Normal.     Epididymis:     Right: Enlarged.     Left: Enlarged.     Comments: Chaperoned by nurse tech Kenard Gower.  Prior history of bilateral hydroceles.    ED Results / Procedures / Treatments   Labs (all labs ordered are listed, but only abnormal results are displayed) Labs Reviewed  URINALYSIS, ROUTINE W REFLEX MICROSCOPIC  GC/CHLAMYDIA PROBE AMP ([place]) NOT AT [facility]    EKG None  Radiology No results  found.  Procedures Procedures   Medications Ordered in ED Medications  cefTRIAXone (ROCEPHIN) injection 500 mg (has no administration in time range)  lidocaine (PF) (XYLOCAINE) 1 % injection 1 mL (has no administration in time range)    ED Course  I have reviewed the triage vital signs and the nursing notes.  Pertinent labs & imaging results that were available during my care of the patient were reviewed by me and considered in my medical decision making (see chart for details).    MDM Rules/Calculators/A&P   18-year-old with a past medical history of sexually transmitted infection presents to the ED with a chief complaint of penile discharge for the past 3 days.  Recently seen last month in March for the same complaint.  He reports symptoms never really improved.  He was treated last time with gentamicin along with azithromycin.  Reports partner did seek treatment and has completed it with resolution in her symptoms.  There is no lymphadenopathy, no testicular pain or testicular swelling.  Does have a prior history of hydroceles, does have an appointment for follow-up with urology in the upcoming weeks.  Exam remarkable for mild discharge noted, chaperoned by Percival Spanish.  No testicular pain, or other findings.  We discussed treatment with Rocephin, does have a prior history of penicillin reaction, however has been treated with Rocephin for STD encounters in the past.  We also discussed treatment of chlamydia with a prescription for doxycycline 1 tablet twice daily for the next 7 days.  He understands and agrees with management.  patient stable for discharge  Portions of this note were generated with Dragon dictation software. Dictation errors may occur despite best attempts at proofreading.  Final Clinical Impression(s) / ED Diagnoses Final diagnoses:  STD exposure    Rx / DC Orders ED Discharge Orders         Ordered    doxycycline (VIBRAMYCIN) 100 MG capsule  2 times daily         06/12/20 1447           Claude Manges, PA-C 06/12/20 1455    Pricilla Loveless, MD 06/13/20 [phone]

## 2020-06-12 NOTE — Discharge Instructions (Addendum)
You were treated today for gonorrhea with a shot of Rocephin.  I have also provided a prescription for antibiotics to treat you for chlamydia.  Please be aware these test results will not be available until the next 48 hours.  However, we have chosen treatment ahead of time.  Please take 1 tablet twice a day for the next 7 days.

## 2020-06-12 NOTE — ED Triage Notes (Signed)
Pt reports white cloudy penile discharge since Sunday and would liked to be checked for STIs.

## 2020-06-13 LAB — GC/CHLAMYDIA PROBE AMP (~~LOC~~) NOT AT ARMC
Chlamydia: NEGATIVE
Comment: NEGATIVE
Comment: NORMAL
Neisseria Gonorrhea: NEGATIVE

## 2020-12-11 IMAGING — CT CT ABD-PELV W/ CM
2 of 5 series · 16 of 46 positions shown, 18 images · IV contrast (APPLIED)
Comparison: None.

CLINICAL DATA: Nausea and lower abdominal pain.

EXAM:
CT ABDOMEN AND PELVIS WITH CONTRAST
TECHNIQUE: Multidetector CT imaging of the abdomen and pelvis was performed
using the standard protocol following bolus administration of
intravenous contrast.
CONTRAST:  100mL OMNIPAQUE IOHEXOL 300 MG/ML  SOLN

[Series 3: abdomen 5.0 · axial · 0.81mm/px · z∈[-376,+29]mm · 13 of 93 slices shown, 15 images]
[im 6/93  soft-tissue]
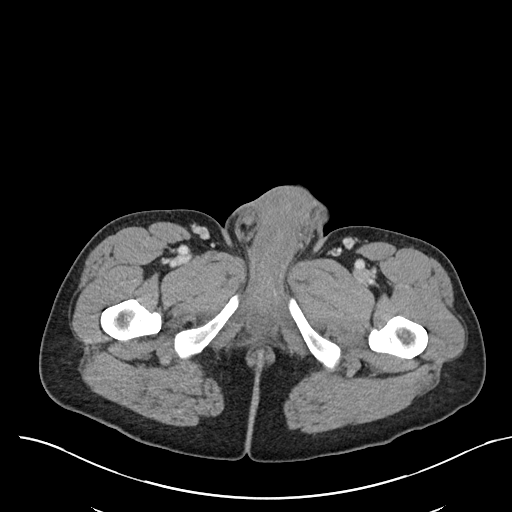
[im 6/93  bone]
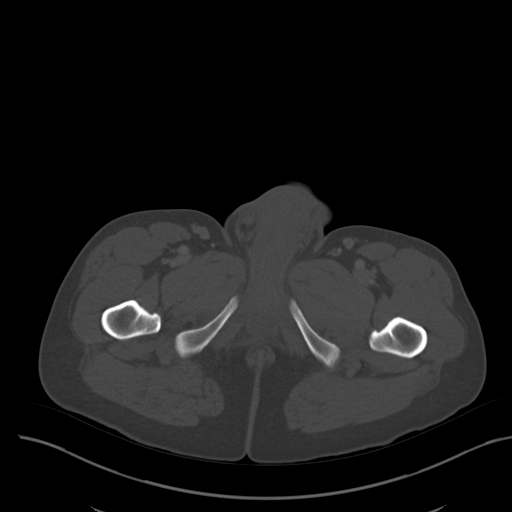
[im 12/93  soft-tissue]
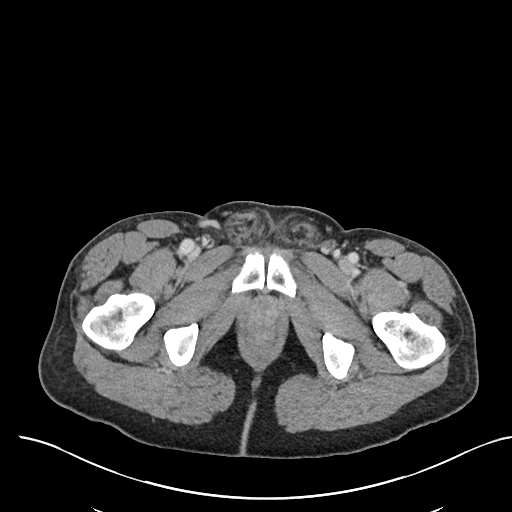
[im 18/93  soft-tissue]
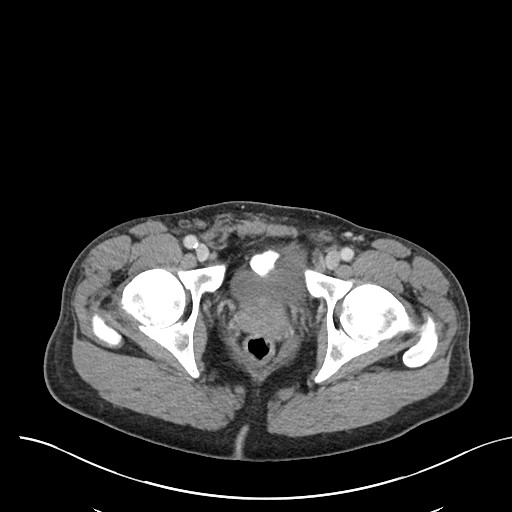
[im 29/93  soft-tissue]
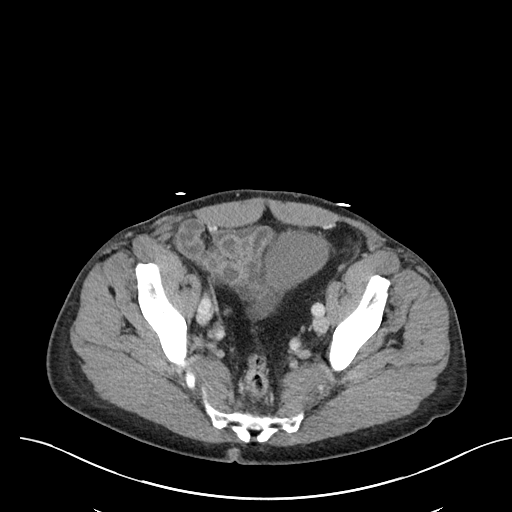
[im 35/93  soft-tissue]
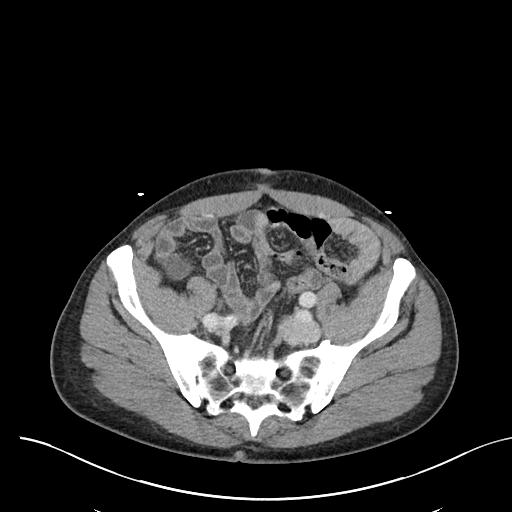
[im 41/93  soft-tissue]
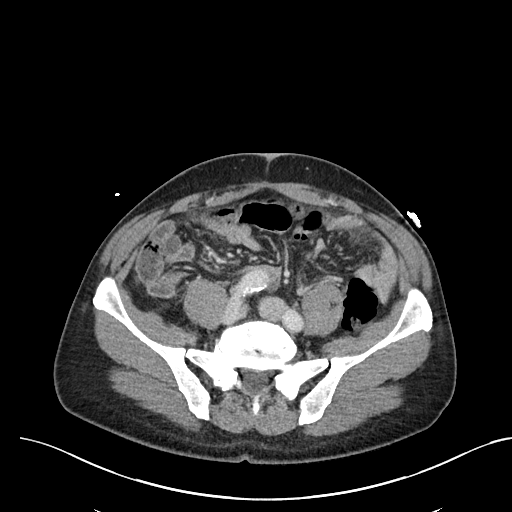
[im 47/93  soft-tissue]
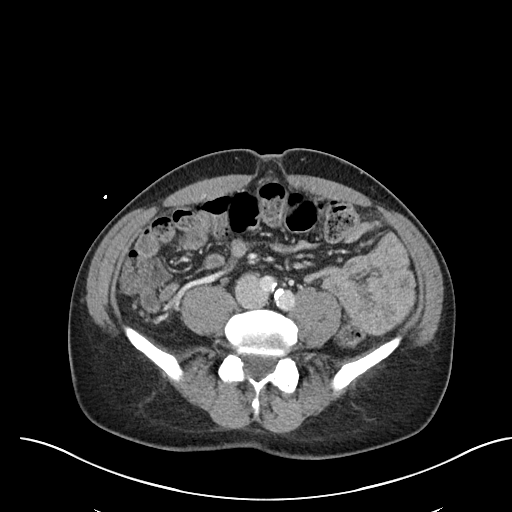
[im 52/93  soft-tissue]
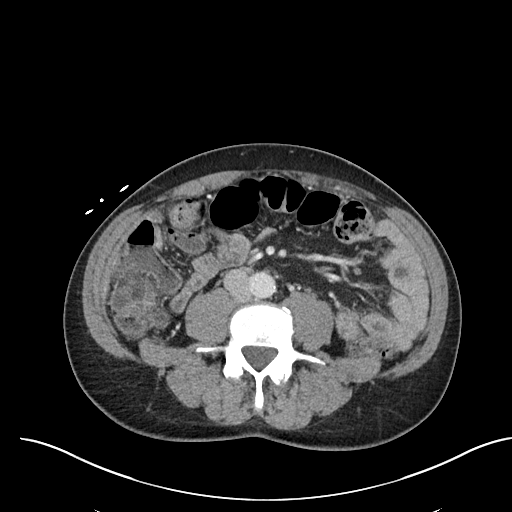
[im 58/93  soft-tissue]
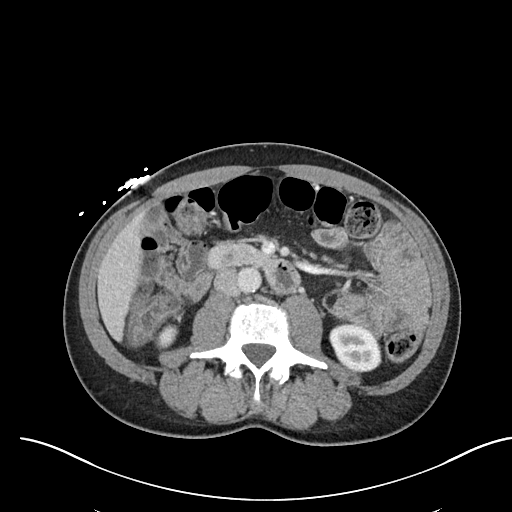
[im 58/93  bone]
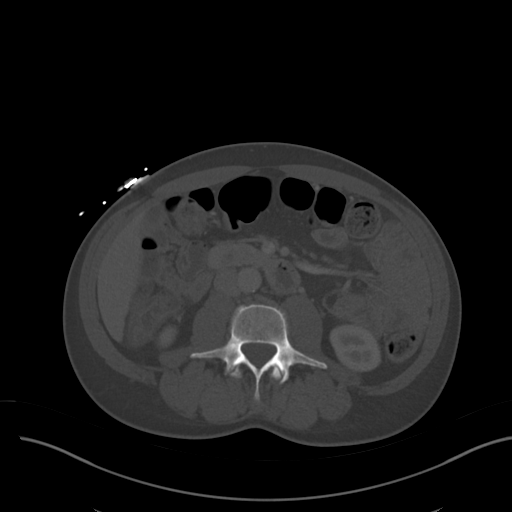
[im 64/93  soft-tissue]
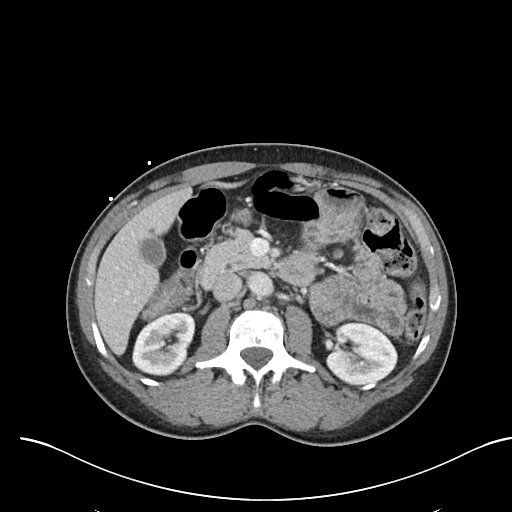
[im 75/93  soft-tissue]
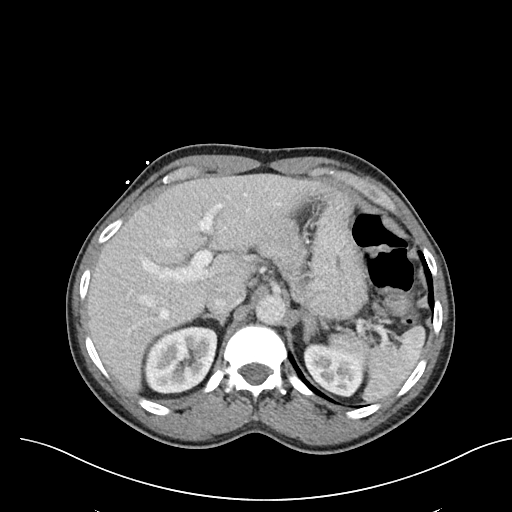
[im 81/93  soft-tissue]
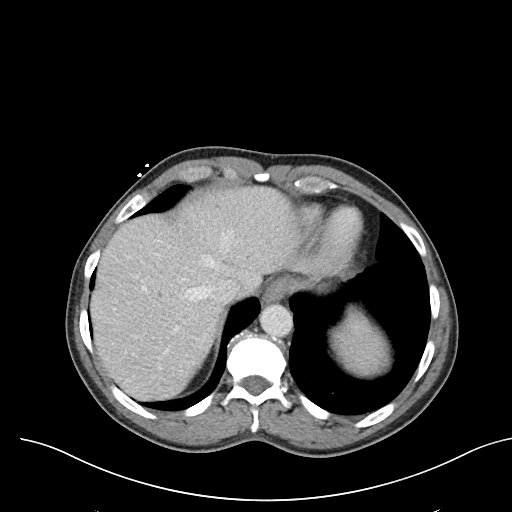
[im 87/93  soft-tissue]
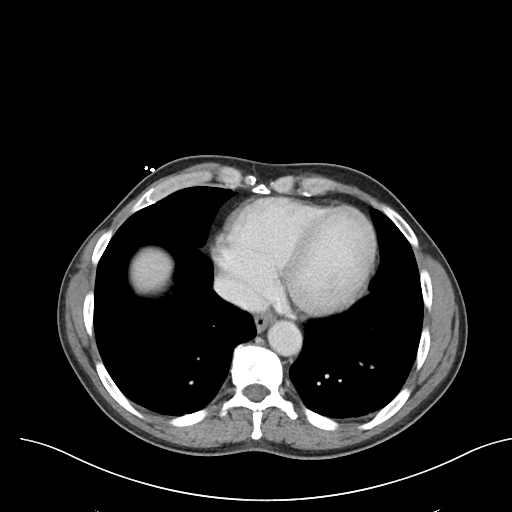

[Series 6: abdomen 3.0 mpr cor · coronal · 0.72mm/px · 3 of 89 slices shown]
[im 30/89  soft-tissue]
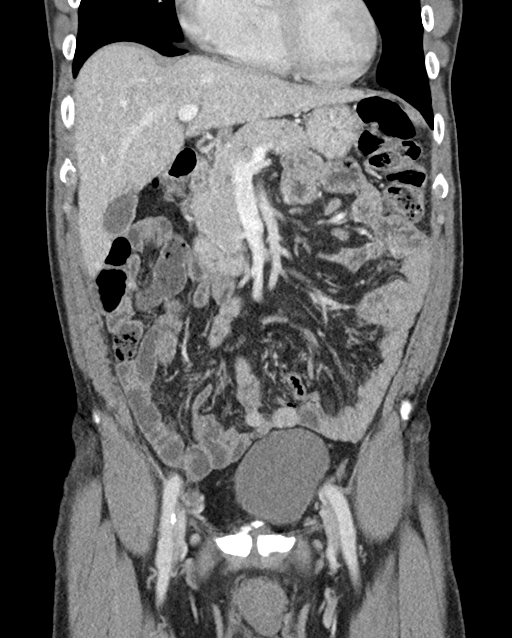
[im 40/89  soft-tissue]
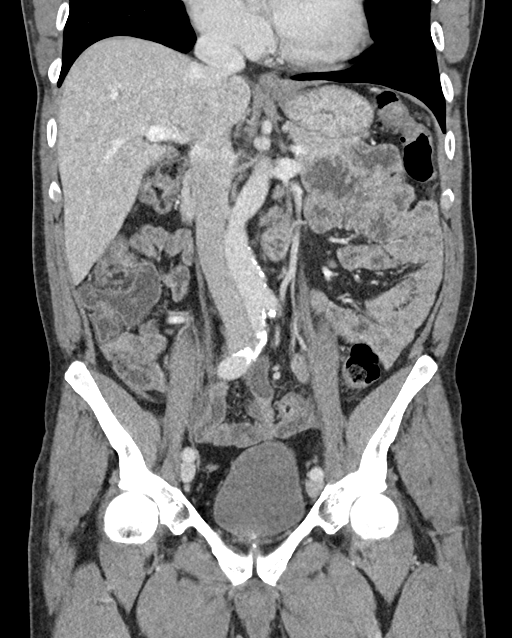
[im 49/89  soft-tissue]
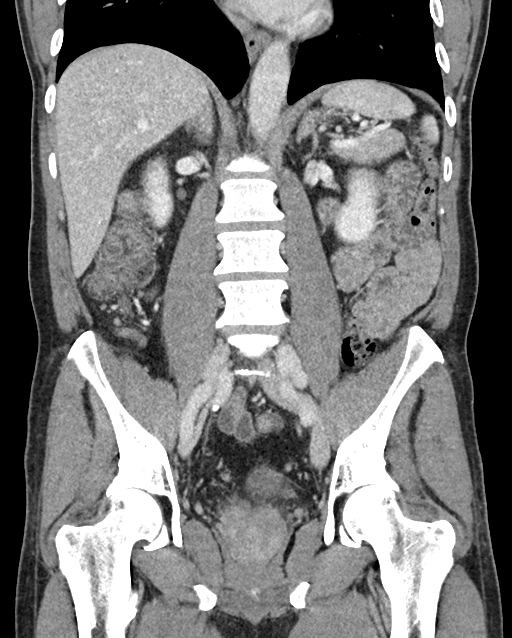

[16 of 46 positions shown; findings below may reference images not displayed]

FINDINGS: Lower chest: Mild dependent changes in the lung bases. Cardiac
enlargement.

Hepatobiliary: Scattered subcentimeter hepatic cysts. No other focal
lesions. Portal veins are patent. Gallbladder and bile ducts are
unremarkable.

Pancreas: Unremarkable. No pancreatic ductal dilatation or
surrounding inflammatory changes.

Spleen: Normal in size without focal abnormality.

Adrenals/Urinary Tract: Adrenal glands are unremarkable. Kidneys are
normal, without renal calculi, focal lesion, or hydronephrosis.
Bladder is unremarkable.

Stomach/Bowel: Stomach is within normal limits. Appendix appears
normal. No evidence of bowel wall thickening, distention, or
inflammatory changes.

Vascular/Lymphatic: Aortic atherosclerosis. No enlarged abdominal or
pelvic lymph nodes.

Reproductive: Prostate is unremarkable.

Other: Small periumbilical hernia containing fat. No free air or
free fluid in the abdomen.

Musculoskeletal: No acute or significant osseous findings.
IMPRESSION: 1. No acute process demonstrated in the abdomen or pelvis. No
evidence of bowel obstruction or inflammation.
2. Small periumbilical hernia containing fat.
3. Aortic atherosclerosis.

Aortic Atherosclerosis ([id number].B).

## 2021-06-01 ENCOUNTER — Encounter (HOSPITAL_COMMUNITY): Payer: Self-pay | Admitting: Emergency Medicine

## 2021-06-01 ENCOUNTER — Emergency Department (HOSPITAL_COMMUNITY)
Admission: EM | Admit: 2021-06-01 | Discharge: 2021-06-01 | Disposition: A | Discharge status: Home / Self Care | Payer: Self-pay | Attending: Emergency Medicine | Admitting: Emergency Medicine

## 2021-06-01 ENCOUNTER — Other Ambulatory Visit: Payer: Self-pay

## 2021-06-01 DIAGNOSIS — Z202 Contact with and (suspected) exposure to infections with a predominantly sexual mode of transmission: Secondary | ICD-10-CM | POA: Insufficient documentation

## 2021-06-01 DIAGNOSIS — N5089 Other specified disorders of the male genital organs: Secondary | ICD-10-CM | POA: Insufficient documentation

## 2021-06-01 LAB — URINALYSIS, ROUTINE W REFLEX MICROSCOPIC
Bilirubin Urine: NEGATIVE
Glucose, UA: NEGATIVE mg/dL
Hgb urine dipstick: NEGATIVE
Ketones, ur: 20 mg/dL — AB
Leukocytes,Ua: NEGATIVE
Nitrite: NEGATIVE
Protein, ur: NEGATIVE mg/dL
Specific Gravity, Urine: 1.027 (ref 1.005–1.030)
pH: 5 (ref 5.0–8.0)

## 2021-06-01 MED ORDER — METRONIDAZOLE 500 MG PO TABS
2000.0000 mg | ORAL_TABLET | Freq: Once | ORAL | Status: AC
  Administered 2021-06-01: 2000 mg via ORAL
  Filled 2021-06-01: qty 4

## 2021-06-01 MED ORDER — GENTAMICIN SULFATE 40 MG/ML IJ SOLN
240.0000 mg | Freq: Once | INTRAMUSCULAR | Status: AC
  Administered 2021-06-01: 240 mg via INTRAMUSCULAR
  Filled 2021-06-01: qty 6

## 2021-06-01 MED ORDER — DOXYCYCLINE HYCLATE 100 MG PO TABS: 100.0000 mg | ORAL_TABLET | Freq: Two times a day (BID) | ORAL | 0 refills | Status: AC

## 2021-06-01 NOTE — ED Triage Notes (Signed)
Patient here POV for STD check. Patient denies any penile discharge, urinary frequency, or painful urination, but complaining of a "funny feeling" in his penis area. Pt also wanting referral to general surgeon for follow up on a hernia repair that he had previously.  ?

## 2021-06-01 NOTE — ED Provider Notes (Signed)
?[facility] EMERGENCY DEPARTMENT ?Provider Note ? ? ?CSN: [id number] ?Arrival date & time: 06/01/21  1035 ? ?  ? ?History ?Chief Complaint  ?Patient presents with  ? Exposure to STD  ? ? ?Keith Romero is a 56 y.o. male with history of right inguinal hernia repair presents the emergency department for evaluation of positive chlamydia exposure as well as some scrotal swelling for the past 2 months.  He denies any dysuria, hematuria, penile discharge, penile pain abdominal pain, back pain.  He reports that he wants a general surgery referral to get his hernia we looked at.  He was seen by Dr. Derrell Lolling at [facility] surgery.  He was told that he needed a referral from the emergency department in order to be seen in the clinic again.  He reports his scrotal swelling is not painful.  This pain no warmth or color changes noted to the area.  No trauma. ? ? ?Exposure to STD ?Pertinent negatives include no chest pain, no abdominal pain and no shortness of breath.  ? ?  ? ?Home Medications ?Prior to Admission medications   ?Medication Sig Start Date End Date Taking? Authorizing Provider  ?metoprolol tartrate (LOPRESSOR) 25 MG tablet Take 1 tablet every 6 hours as needed for afib HR over 100 ?Patient not taking: Reported on 10/07/2019 01/27/19   Fenton, Levonne Spiller R, PA  ?traMADol (ULTRAM) 50 MG tablet Take 1 tablet (50 mg total) by mouth every 6 (six) hours as needed. 10/19/19   Geoffery Lyons, MD  ?   ? ?Allergies    ?Penicillins   ? ?Review of Systems   ?Review of Systems  ?Constitutional:  Negative for chills and fever.  ?Respiratory:  Negative for shortness of breath.   ?Cardiovascular:  Negative for chest pain.  ?Gastrointestinal:  Negative for abdominal pain, constipation, diarrhea, nausea and vomiting.  ?Genitourinary:  Positive for scrotal swelling. Negative for dysuria, flank pain, frequency, genital sores, hematuria, penile discharge, penile pain, penile swelling, testicular pain and urgency.   ?Musculoskeletal:  Negative for back pain.  ? ?Physical Exam ?Updated Vital Signs ?BP (!) 127/99 (BP Location: Right Arm)   Pulse 69   Temp 98 ?F (36.7 ?C) (Oral)   Resp 18   Ht 6' (1.829 m)   Wt 86.2 kg   SpO2 100%   BMI 25.77 kg/m?  ?Physical Exam ?Vitals and nursing note reviewed. Exam conducted with a chaperone present Aundra Millet, Charity fundraiser).  ?Constitutional:   ?   Appearance: Normal appearance.  ?Eyes:  ?   General: No scleral icterus. ?Pulmonary:  ?   Effort: Pulmonary effort is normal. No respiratory distress.  ?Abdominal:  ?   General: Bowel sounds are normal.  ?   Palpations: Abdomen is soft.  ?   Tenderness: There is no abdominal tenderness. There is no guarding or rebound.  ?   Hernia: There is no hernia in the left inguinal area.  ?Genitourinary: ?   Penis: Normal and circumcised. No tenderness, discharge or lesions.   ?   Comments: Testicles are palpable bilaterally and appear to be normal size.  Patient does have significant swelling to his scrotum, mainly on the right.  It is nontender.  There is no overlying erythema or warmth.  No color changes noted.  No rash.  It does not extend into his thighs.  Unable to palpate right inguinal canal due to mesh. ?Skin: ?   General: Skin is dry.  ?   Findings: No rash.  ?Neurological:  ?  General: No focal deficit present.  ?   Mental Status: He is alert. Mental status is at baseline.  ?Psychiatric:     ?   Mood and Affect: Mood normal.  ? ? ?ED Results / Procedures / Treatments   ?Labs ?(all labs ordered are listed, but only abnormal results are displayed) ?Labs Reviewed  ?URINALYSIS, ROUTINE W REFLEX MICROSCOPIC - Abnormal; Notable for the following components:  ?    Result Value  ? APPearance HAZY (*)   ? Ketones, ur 20 (*)   ? All other components within normal limits  ?GC/CHLAMYDIA PROBE AMP ([place]) NOT AT [facility]  ? ? ?EKG ?None ? ?Radiology ?No results found. ? ?Procedures ?Procedures  ? ?Medications Ordered in ED ?Medications - No data to display ? ?ED  Course/ Medical Decision Making/ A&P ?Clinical Course as of 06/01/21 1417  ?Wynelle LinkSun Jun 01, 2021  ?1333 This is a 56 year old male with a history of an inguinal hernia surgery repair in the past, presenting to the ED with scrotal edema.  The patient wanted STI testing.  On exam he has diffuse, nontender edema of the scrotum, equal bilaterally, nontender testes.  The surgical site appears clean and intact.  I cannot appreciate any firm or incarcerated hernia the patient appears very comfortable, doubt incarcerated hernia, he is having regular bowel movements and no vomiting.  I do suspect he is having some vascular outflow or venous congestion issues, likely related to some impingement of the scrotal veins.  For now I recommended scrotal elevation at home.  Advised follow-up with the surgeon's clinic, which he will do so [MT]  ?  ?Clinical Course User Index ?[MT] Terald Sleeperrifan, Matthew J, MD  ? ?                        ?Medical Decision Making ?Amount and/or Complexity of Data Reviewed ?Labs: ordered. ? ?Risk ?Prescription drug management. ? ?56 year old male presents emergency department for evaluation of recent chlamydia exposure with his male partner.  He denies any penile swelling, pain, dysuria, hematuria, penile swelling, testicular pain.  He does mention has been having some scrotal swelling the past 2 months and thinks it is his hernia again.  Differential diagnosis includes was not limited to epididymis, vascular congestion, hernia, edema, STD.  Vital signs show mildly elevated blood pressure 147/99.  Patient afebrile, normal pulse rate, satting well on room air without any increased work of breathing.  Physical exam is pertinent for some enlarged scrotum.  Testicles are palpable and nontender.  No overlying erythema or warmth noted to the area.  The swelling does not extend into the penis or into the legs. ? ?Given the swelling although not painful, consider ultrasound or CT.  Had my attending assessed at bedside  who does not recommend any imaging at this time.  He states vascular out for some venous congestion issues likely related to some impingement of the scrotal veins given his hernia repair in the past. ? ?I independently reviewed and interpreted the patient's labs.  Urine shows hazy urine with some ketones although there is no white blood cells, bacteria, or leukocytes visible.  Gonorrhea and chlamydia pending. ? ?Discussed treatment for chlamydia alone or treating for chlamydia, gonorrhea, and trichomonas.  The patient would like prophylactic treatment for those STDs as well. ? ?Given the patient's allergy to penicillins, the patient was given gentamicin, Flagyl and sent home with doxycycline for 1 week.  Safe sex practices were discussed with  the patient.  I discussed that he will need to abstain from any sexual contact/intercourse for at least 2 weeks after completion of the doxycycline.  I discussed that his partner will need to be treated as well.  Discussed the risks of reinfection or infecting others if having intercourse or partial contact before when he has time.  Dr. Derrell Lolling in [place] surgery was a surgeon repaired his hernia the first, and included his information to discharge for work for the patient to follow-up with.  Strict return precautions were discussed.  The patient verbalized understanding and agrees to plan.  Patient is stable being discharged home in good condition. ? ?I discussed this case with my attending physician who cosigned this note including patient's presenting symptoms, physical exam, and planned diagnostics and interventions. Attending physician stated agreement with plan or made changes to plan which were implemented.  ? ?Attending physician assessed patient at bedside. ? ?Final Clinical Impression(s) / ED Diagnoses ?Final diagnoses:  ?Scrotal edema  ?Exposure to STD  ? ? ?Rx / DC Orders ?ED Discharge Orders   ? ?      Ordered  ?  doxycycline (VIBRA-TABS) 100 MG tablet  2  times daily       ? 06/01/21 1359  ? ?  ?  ? ?  ? ? ?  ?Achille Rich, PA-C ?06/01/21 1555 ? ?  ?Terald Sleeper, MD ?06/02/21 [phone] ? ?

## 2021-06-01 NOTE — Discharge Instructions (Signed)
You were seen here today for evaluation of your recent positive STD contact. We have treated you for gonorrhea, chlamydia, and trichomonas.  Please follow-up with your health department for testing for syphilis and HIV.  For treatment of your chlamydia, I sent you in a prescription called doxycycline for you to take twice daily for the next 7 days.  Please abstain from any sexual contact for the next 3 weeks.  Please get retested for test of cure for your STDs.  I would abstain for any sexual intercourse as you can reinfect your partners or become reinfected yourself.  For your testicular swelling, I have attached information for Dr. Johney Frame office for you to call and follow-up with.  If you have any pain, pain when you urinate, blood in your urine, abdominal pain, fever, please return the nearest emergency department for evaluation. ? ?Contact a health care provider if: ?You have sudden pain that is persistent and does not improve. ?You have a heavy feeling or notice fluid in the scrotum. ?You have pain or burning while urinating. ?You have blood in your urine or semen. ?You feel a lump around the testicle. ?You notice that one testicle is larger than the other. Keep in mind that a small difference in size is normal. ?You have a persistent dull ache or pain in your groin or scrotum. ?Get help right away if: ?The pain does not go away. ?The pain becomes severe. ?You have a fever or chills. ?You have pain or vomiting that cannot be controlled. ?One or both sides of the scrotum are very red and swollen. ?There is redness spreading upward from your scrotum to your abdomen or downward from your scrotum to your thighs. ?

## 2023-05-28 ENCOUNTER — Encounter (HOSPITAL_BASED_OUTPATIENT_CLINIC_OR_DEPARTMENT_OTHER): Payer: Self-pay | Admitting: Emergency Medicine

## 2023-05-28 ENCOUNTER — Other Ambulatory Visit: Payer: Self-pay

## 2023-05-28 ENCOUNTER — Emergency Department (HOSPITAL_BASED_OUTPATIENT_CLINIC_OR_DEPARTMENT_OTHER)
Admission: EM | Admit: 2023-05-28 | Discharge: 2023-05-28 | Disposition: A | Discharge status: Home / Self Care | Attending: Emergency Medicine | Admitting: Emergency Medicine

## 2023-05-28 ENCOUNTER — Emergency Department (HOSPITAL_BASED_OUTPATIENT_CLINIC_OR_DEPARTMENT_OTHER)

## 2023-05-28 DIAGNOSIS — R0602 Shortness of breath: Secondary | ICD-10-CM | POA: Insufficient documentation

## 2023-05-28 DIAGNOSIS — R1310 Dysphagia, unspecified: Secondary | ICD-10-CM | POA: Diagnosis present

## 2023-05-28 DIAGNOSIS — F419 Anxiety disorder, unspecified: Secondary | ICD-10-CM | POA: Diagnosis not present

## 2023-05-28 MED ORDER — MAALOX MAX 400-400-40 MG/5ML PO SUSP: 15.0000 mL | Freq: Four times a day (QID) | ORAL | 0 refills | Status: AC | PRN

## 2023-05-28 MED ORDER — FAMOTIDINE 20 MG PO TABS: 20.0000 mg | ORAL_TABLET | Freq: Two times a day (BID) | ORAL | 0 refills | Status: AC | PRN

## 2023-05-28 MED ORDER — FAMOTIDINE 20 MG PO TABS
20.0000 mg | ORAL_TABLET | Freq: Once | ORAL | Status: AC
Start: 2023-05-28 — End: 2023-05-28
  Administered 2023-05-28: 20 mg via ORAL
  Filled 2023-05-28: qty 1

## 2023-05-28 MED ORDER — ALUM & MAG HYDROXIDE-SIMETH 200-200-20 MG/5ML PO SUSP
30.0000 mL | Freq: Once | ORAL | Status: AC
Start: 2023-05-28 — End: 2023-05-28
  Administered 2023-05-28: 30 mL via ORAL
  Filled 2023-05-28: qty 30

## 2023-05-28 NOTE — ED Triage Notes (Signed)
 Shob, indigestion and nausea onset this morning. Reports h/o of anxiety and states he feels more anxious than normal. Denies chest pain, vomiting, dizziness.

## 2023-05-28 NOTE — ED Provider Notes (Signed)
 [place] EMERGENCY DEPARTMENT AT [facility] Provider Note   CSN: [id number] Arrival date & time: 05/28/23  2118     History  Chief Complaint  Patient presents with   Shortness of Breath   Anxiety    Keith Romero is a 58 y.o. male.   Shortness of Breath Anxiety Associated symptoms include shortness of breath.    58 year old male presents emergency department with complaints of left ingested, feelings of food getting caught.  States that he got home from work this morning and ate a piece of fried chicken.  States that he felt like he had alcohol in his throat.  States that he then felt anxious having shortness of breath, increased anxious feelings.  States a history of anxiety and this felt similar.  States that the food passed to his esophagus and he noted improvement of symptoms.  Does report some residual burning type sensation in the area.  States that he feels like he has indigestion.  Denies any chest pain, exertional worsening of symptoms, current abdominal pain, nausea, vomiting.  Past medical history significant for atrial fibrillation, inguinal hernia  Home Medications Prior to Admission medications   Medication Sig Start Date End Date Taking? Authorizing Provider  alum & mag hydroxide-simeth (MAALOX MAX) 400-400-40 MG/5ML suspension Take 15 mLs by mouth every 6 (six) hours as needed for indigestion. 05/28/23  Yes Neil Balls A, PA  famotidine  (PEPCID ) 20 MG tablet Take 1 tablet (20 mg total) by mouth 2 (two) times daily as needed for heartburn or indigestion. 05/28/23  Yes Neil Balls A, PA  doxycycline  (VIBRA -TABS) 100 MG tablet Take 1 tablet (100 mg total) by mouth 2 (two) times daily. 06/01/21   Spence Dux, PA-C  metoprolol  tartrate (LOPRESSOR ) 25 MG tablet Take 1 tablet every 6 hours as needed for afib HR over 100 Patient not taking: Reported on 10/07/2019 01/27/19   Fenton, Clint R, PA  traMADol  (ULTRAM ) 50 MG tablet Take 1 tablet (50 mg total)  by mouth every 6 (six) hours as needed. 10/19/19   Orvilla Blander, MD      Allergies    Penicillins    Review of Systems   Review of Systems  Respiratory:  Positive for shortness of breath.   All other systems reviewed and are negative.   Physical Exam Updated Vital Signs BP (!) 142/86 (BP Location: Left Arm)   Pulse 63   Temp 98.9 F (37.2 C)   Resp 16   Ht 6' (1.829 m)   Wt 86.2 kg   SpO2 100%   BMI 25.77 kg/m  Physical Exam Vitals and nursing note reviewed.  Constitutional:      General: He is not in acute distress.    Appearance: He is well-developed.  HENT:     Head: Normocephalic and atraumatic.  Eyes:     Conjunctiva/sclera: Conjunctivae normal.  Cardiovascular:     Rate and Rhythm: Normal rate and regular rhythm.     Heart sounds: No murmur heard. Pulmonary:     Effort: Pulmonary effort is normal. No respiratory distress.     Breath sounds: Normal breath sounds. No wheezing, rhonchi or rales.  Abdominal:     Palpations: Abdomen is soft.     Tenderness: There is no abdominal tenderness. There is no guarding.  Musculoskeletal:        General: No swelling.     Cervical back: Neck supple.  Skin:    General: Skin is warm and dry.  Capillary Refill: Capillary refill takes less than 2 seconds.  Neurological:     Mental Status: He is alert.  Psychiatric:        Mood and Affect: Mood normal.    ED Results / Procedures / Treatments   Labs (all labs ordered are listed, but only abnormal results are displayed) Labs Reviewed - No data to display  EKG EKG Interpretation Date/Time:  Friday May 28 2023 21:26:33 EDT Ventricular Rate:  60 PR Interval:  175 QRS Duration:  110 QT Interval:  416 QTC Calculation: 416 R Axis:   41  Text Interpretation: Sinus rhythm Confirmed by Zackowski, Scott [phone]) on 05/28/2023 9:35:29 PM  Radiology No results found.  Procedures Procedures    Medications Ordered in ED Medications  alum & mag hydroxide-simeth  (MAALOX/MYLANTA) 200-200-20 MG/5ML suspension 30 mL (30 mLs Oral Given 05/28/23 2219)  famotidine  (PEPCID ) tablet 20 mg (20 mg Oral Given 05/28/23 2219)    ED Course/ Medical Decision Making/ A&P                                 Medical Decision Making Amount and/or Complexity of Data Reviewed Radiology: ordered.  Risk OTC drugs.   This patient presents to the ED for concern of food bolus impaction, increased anxious feelings, this involves an extensive number of treatment options, and is a complaint that carries with it a high risk of complications and morbidity.  The differential diagnosis includes food bolus impaction, GERD, ACS, PE, esophageal perforation   Co morbidities that complicate the patient evaluation  See HPI   Additional history obtained:  Additional history obtained from EMR External records from outside source obtained and reviewed including hospital records   Lab Tests:  N/a   Imaging Studies ordered:  I ordered imaging studies including Chest x-ray  I independently visualized and interpreted imaging which showed no acute cardiopulmonary abnormality I agree with the radiologist interpretation   Cardiac Monitoring: / EKG:  The patient was maintained on a cardiac monitor.  I personally viewed and interpreted the cardiac monitored which showed an underlying rhythm of: Sinus rhythm   Consultations Obtained:  N/a   Problem List / ED Course / Critical interventions / Medication management  Odynophagia I ordered medication including Maalox, Pepcid    Reevaluation of the patient after these medicines showed that the patient improved I have reviewed the patients home medicines and have made adjustments as needed   Social Determinants of Health:  Denies tobacco, licit drug use.   Test / Admission - Considered:  Odynophagia Vitals signs significant for hyper blood pressure 142/86. Otherwise within normal range and stable throughout  visit. Laboratory/imaging studies significant for: See above 58 year old male presents emergency department with complaints of left ingested, feelings of food getting caught.  States that he got home from work this morning and ate a piece of fried chicken.  States that he felt like he had alcohol in his throat.  States that he then felt anxious having shortness of breath, increased anxious feelings.  States a history of anxiety and this felt similar.  States that the food passed to his esophagus and he noted improvement of symptoms.  Does report some residual burning type sensation in the area.  States that he feels like he has indigestion.  Denies any chest pain, exertional worsening of symptoms, current abdominal pain, nausea, vomiting. On exam, no abdominal tenderness.  Lungs clear to auscultation bilaterally.  Chest x-ray performed showed no evidence of acute cardiopulmonary abnormality or evidence of esophageal perforation.  EKG without evidence of ischemia.  Suspect the patient most likely had temporary food bolus impaction that resolved independent of therapy.  Suspect that he could have irritation leftover from his esophagus given the burning type sensation.  Did note improvement with GI cocktail in the ED.  Will recommend similar medications in the outpatient setting for symptoms and follow-up with PCP/GI.  Treatment plan discussed with patient and he knowledge understanding was agreeable to said plan.  Patient overall well-appearing, afebrile in no acute distress, tolerating p.o. without difficulty.. Worrisome signs and symptoms were discussed with the patient, and the patient acknowledged understanding to return to the ED if noticed. Patient was stable upon discharge.          Final Clinical Impression(s) / ED Diagnoses Final diagnoses:  Odynophagia    Rx / DC Orders ED Discharge Orders          Ordered    famotidine  (PEPCID ) 20 MG tablet  2 times daily PRN        05/28/23 2223     alum & mag hydroxide-simeth (MAALOX MAX) 400-400-40 MG/5ML suspension  Every 6 hours PRN        05/28/23 2223              [place], [place] 05/28/23 2331    Zackowski, Scott, MD 06/04/23 [phone]

## 2023-05-28 NOTE — Discharge Instructions (Addendum)
 As discussed, will place you on Pepcid  for treatment of your symptoms.  You may take this as needed.  See if patient does your discharge papers regarding dietary changes that you may try to decrease likelihood of symptoms in the future.  Recommend follow-up department care for reassessment.  Please do not hesitate to return if the worrisome signs and symptoms we discussed become apparent.
